# Patient Record
Sex: Female | Born: 1995 | Race: Black or African American | Hispanic: No | Marital: Single
Health system: Midwestern US, Community
[De-identification: ages and names within clinical notes are randomized; demographics above are authoritative.]

## PROBLEM LIST (undated history)

## (undated) DIAGNOSIS — R1900 Intra-abdominal and pelvic swelling, mass and lump, unspecified site: Secondary | ICD-10-CM

---
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
## 2013-07-09 ENCOUNTER — Encounter (HOSPITAL_COMMUNITY): Payer: Self-pay | Admitting: Nurse Practitioner

## 2013-07-09 ENCOUNTER — Emergency Department (HOSPITAL_COMMUNITY)
Admission: EM | Admit: 2013-07-09 | Discharge: 2013-07-09 | Disposition: A | Discharge status: Home / Self Care | Payer: Medicaid Other | Attending: Emergency Medicine | Admitting: Emergency Medicine

## 2013-07-09 ENCOUNTER — Emergency Department (HOSPITAL_COMMUNITY): Payer: Medicaid Other

## 2013-07-09 DIAGNOSIS — R112 Nausea with vomiting, unspecified: Secondary | ICD-10-CM | POA: Insufficient documentation

## 2013-07-09 DIAGNOSIS — R51 Headache: Secondary | ICD-10-CM | POA: Insufficient documentation

## 2013-07-09 MED ORDER — ONDANSETRON HCL 4 MG/2ML IJ SOLN
4.0000 mg | Freq: Once | INTRAMUSCULAR | Status: AC
  Administered 2013-07-09: 4 mg via INTRAVENOUS
  Filled 2013-07-09: qty 2

## 2013-07-09 MED ORDER — DIPHENHYDRAMINE HCL 50 MG/ML IJ SOLN
25.0000 mg | Freq: Once | INTRAMUSCULAR | Status: AC
  Administered 2013-07-09: 25 mg via INTRAVENOUS
  Filled 2013-07-09: qty 1

## 2013-07-09 MED ORDER — SODIUM CHLORIDE 0.9 % IV BOLUS (SEPSIS)
1000.0000 mL | Freq: Once | INTRAVENOUS | Status: AC
  Administered 2013-07-09: 1000 mL via INTRAVENOUS

## 2013-07-09 MED ORDER — METOCLOPRAMIDE HCL 5 MG/ML IJ SOLN
10.0000 mg | Freq: Once | INTRAMUSCULAR | Status: AC
  Administered 2013-07-09: 10 mg via INTRAVENOUS
  Filled 2013-07-09: qty 2

## 2013-07-09 MED ORDER — KETOROLAC TROMETHAMINE 30 MG/ML IJ SOLN
30.0000 mg | Freq: Once | INTRAMUSCULAR | Status: AC
  Administered 2013-07-09: 30 mg via INTRAVENOUS
  Filled 2013-07-09: qty 1

## 2013-07-09 NOTE — ED Notes (Signed)
C/o frontal headache x 4 days with vomiting, unrelieved with OTC caffeine pills. Has been unable to tolerate oral intake. Denies fevers

## 2013-07-09 NOTE — ED Provider Notes (Signed)
CSN: [id number]     Arrival date & time 07/09/13  1058 History   First MD Initiated Contact with Patient 07/09/13 1200     Chief Complaint  Patient presents with  . Headache   (Consider location/radiation/quality/duration/timing/severity/associated sxs/prior Treatment) HPI Comments: Patient complains of a four-day history of frontal headache associated with nausea and vomiting. This is gradually gotten worse. She woke up with it 4 days ago and slowly progress. Denies thunderclap onset. Denies any focal weakness, numbness or tingling. No history of migraine or headaches otherwise. Denies any fevers. Denies any abdominal pain, chest pain, cough or congestion. She's taking caffeine pills without relief.  The history is provided by the patient.    History reviewed. No pertinent past medical history. History reviewed. No pertinent past surgical history. History reviewed. No pertinent family history. History  Substance Use Topics  . Smoking status: Never Smoker   . Smokeless tobacco: Not on file  . Alcohol Use: No   OB History   Grav Para Term Preterm Abortions TAB SAB Ect Mult Living                 Review of Systems  Constitutional: Negative for fever, activity change and appetite change.  HENT: Negative for congestion and rhinorrhea.   Eyes: Positive for photophobia. Negative for visual disturbance.  Respiratory: Negative for cough, chest tightness and shortness of breath.   Cardiovascular: Negative for chest pain.  Gastrointestinal: Positive for nausea and vomiting. Negative for abdominal pain.  Genitourinary: Negative for dysuria, hematuria, vaginal bleeding and vaginal discharge.  Musculoskeletal: Negative for back pain.  Skin: Negative for rash.  Neurological: Positive for headaches. Negative for dizziness and weakness.  A complete 10 system review of systems was obtained and all systems are negative except as noted in the HPI and PMH.    Allergies  Review of patient's  allergies indicates no known allergies.  Home Medications  No current outpatient prescriptions on file. BP 102/65  Pulse 79  Temp(Src) 99.1 F (37.3 C) (Oral)  Resp 16  SpO2 100%  LMP 07/03/2013 Physical Exam  Constitutional: She is oriented to person, place, and time. She appears well-developed and well-nourished. No distress.  HENT:  Head: Normocephalic and atraumatic.  Mouth/Throat: Oropharynx is clear and moist. No oropharyngeal exudate.  Eyes: Conjunctivae and EOM are normal. Pupils are equal, round, and reactive to light.  No appreciable papilledema  Neck: Normal range of motion. Neck supple.  No meningismus  Cardiovascular: Normal rate, regular rhythm and normal heart sounds.   No murmur heard. Pulmonary/Chest: Effort normal and breath sounds normal. No respiratory distress.  Abdominal: Soft. There is no tenderness. There is no rebound and no guarding.  Musculoskeletal: Normal range of motion. She exhibits no edema and no tenderness.  Neurological: She is alert and oriented to person, place, and time. No cranial nerve deficit. She exhibits normal muscle tone. Coordination normal.  CN 2-12 intact, no ataxia on finger to nose, no nystagmus, 5/5 strength throughout, no pronator drift, Romberg negative, normal gait.   Skin: Skin is warm.    ED Course  Procedures (including critical care time) Labs Review Labs Reviewed - No data to display Imaging Review Ct Head Wo Contrast  07/09/2013   *RADIOLOGY REPORT*  Clinical Data: Frontal headache for 4 days with vomiting,  CT HEAD WITHOUT CONTRAST  Technique:  Contiguous axial images were obtained from the base of the skull through the vertex without contrast.  Comparison: None.  Findings: Calvarium intact.  No  hemorrhage, infarct, mass, or hydrocephalus.  No extra-axial fluid.  IMPRESSION: Normal study   Original Report Authenticated By: Esperanza Heir, M.D.    MDM   1. Headache    Gradual onset headache with nausea, vomiting,  photophobia with 4 days duration. No focal weakness, numbness or tingling.  Neurological exam intact. CT head is negative.  Patient's headache sounds migrainous or possibly pseudotumor cerebra related given her body habitus. No visual changes.   Nevertheless her headache is improved with headache cocktail. she feels ready to go home. She is able to tolerate by mouth. She will be given neurology and PCP followup return precautions discussed.  Glynn Octave, MD 07/09/13 [phone]

## 2013-07-09 NOTE — ED Notes (Signed)
Patient transported to CT 

## 2014-08-31 IMAGING — CT CT HEAD W/O CM
2 series · 16 of 30 positions shown, 20 images · non-contrast
Comparison: None.

CLINICAL DATA: Frontal headache for 4 days with vomiting,

CT HEAD WITHOUT CONTRAST
TECHNIQUE: Contiguous axial images were obtained from the base of
the skull through the vertex without contrast.

[Series 2: head w/o · axial · non-contrast · 0.49mm/px · z∈[+118,+243]mm · 13 of 31 slices shown, 17 images]
[im 3/31  brain]
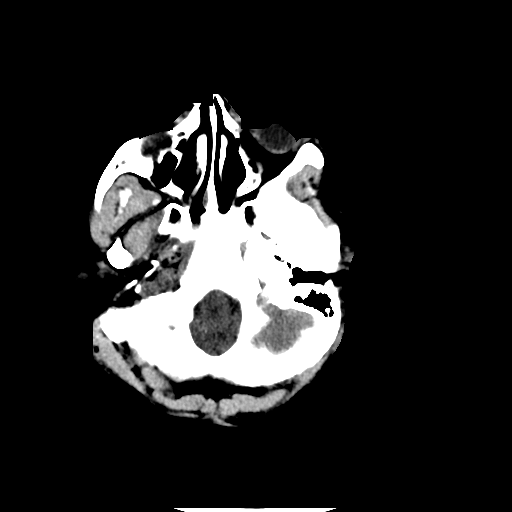
[im 3/31  bone]
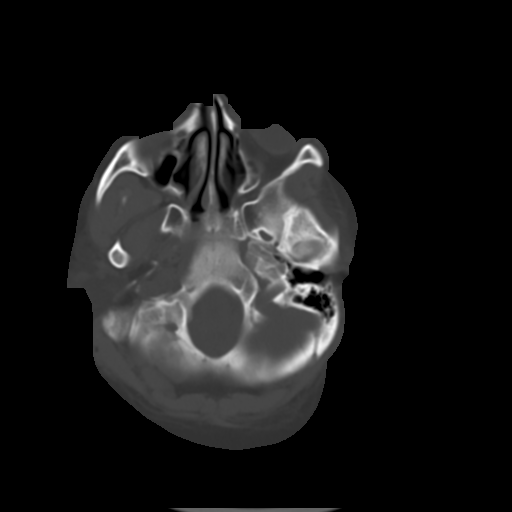
[im 5/31  brain]
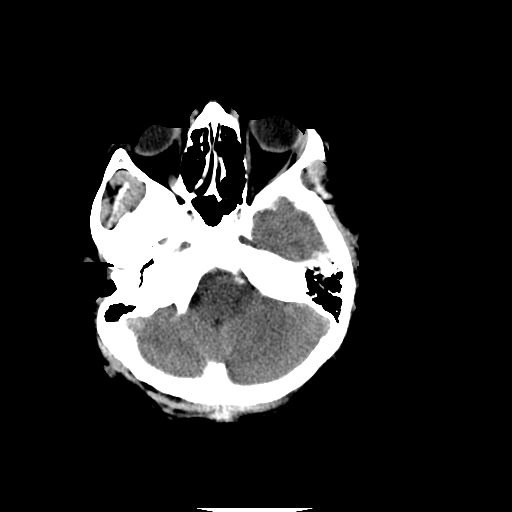
[im 7/31  brain]
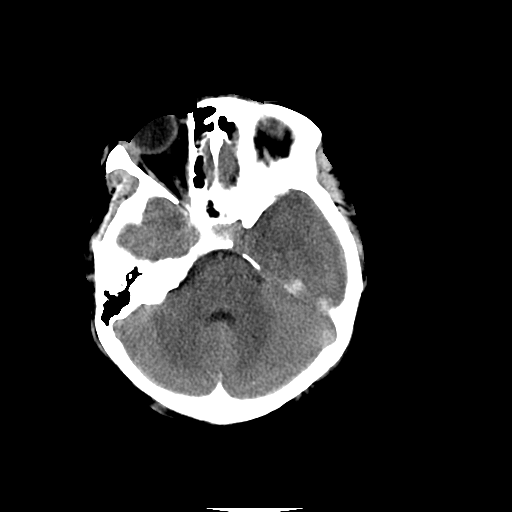
[im 9/31  brain]
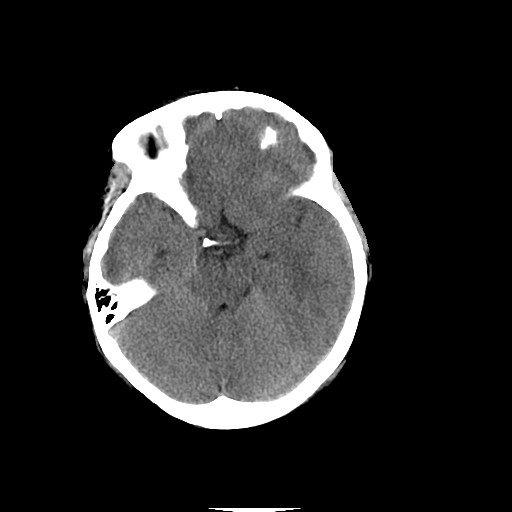
[im 11/31  brain]
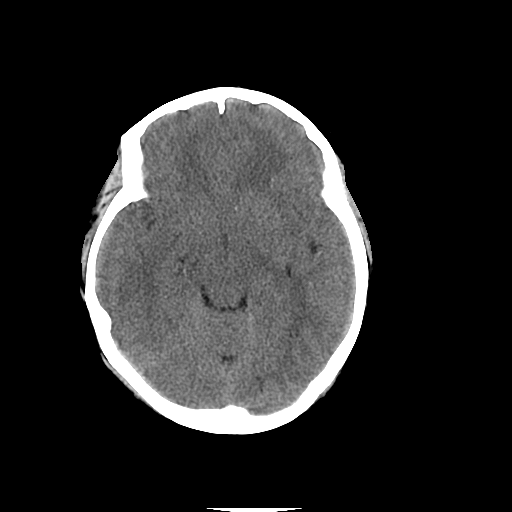
[im 11/31  bone]
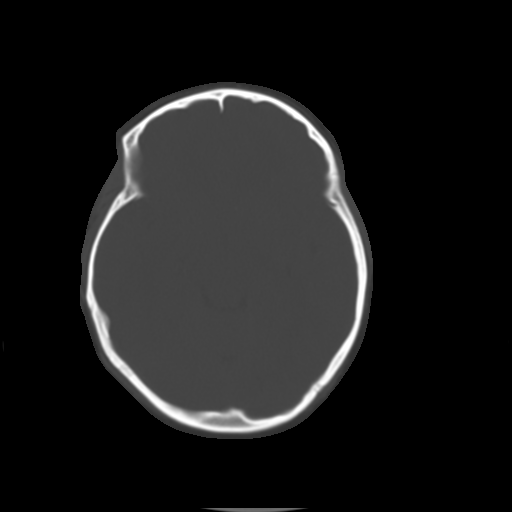
[im 13/31  brain]
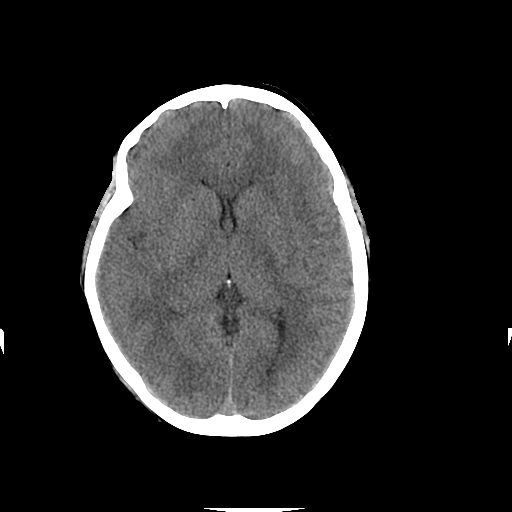
[im 16/31  brain]
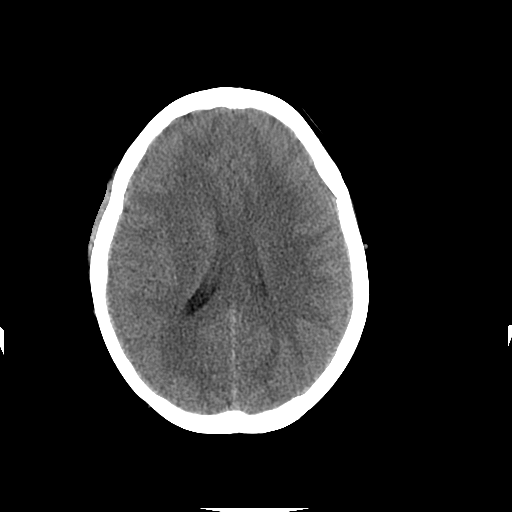
[im 18/31  brain]
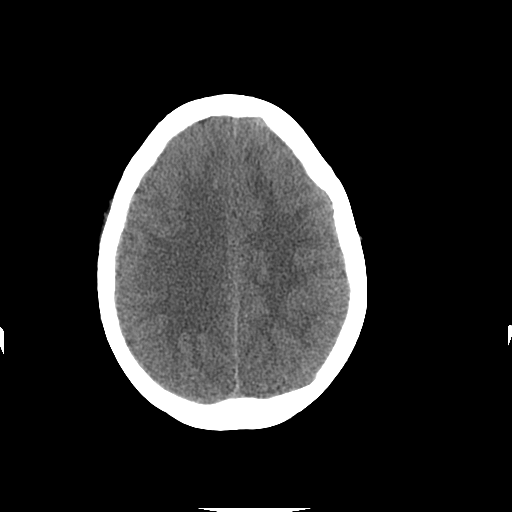
[im 20/31  brain]
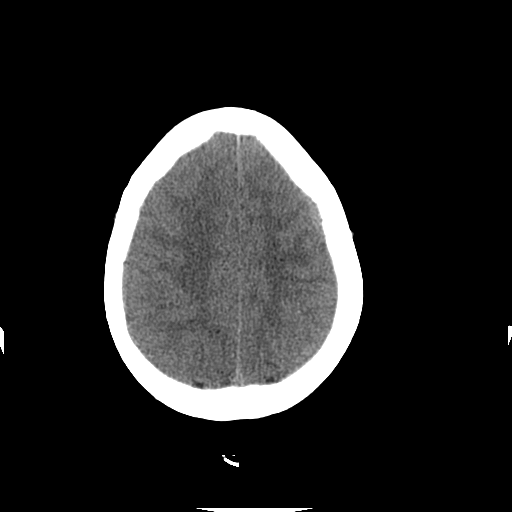
[im 20/31  bone]
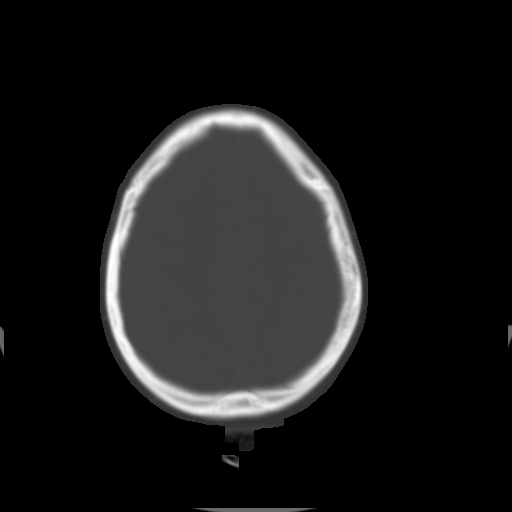
[im 22/31  brain]
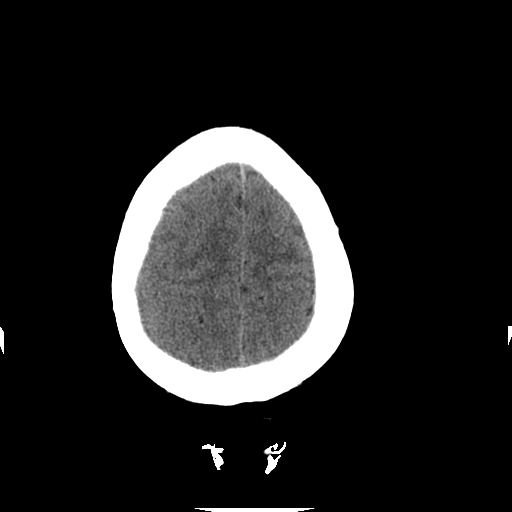
[im 24/31  brain]
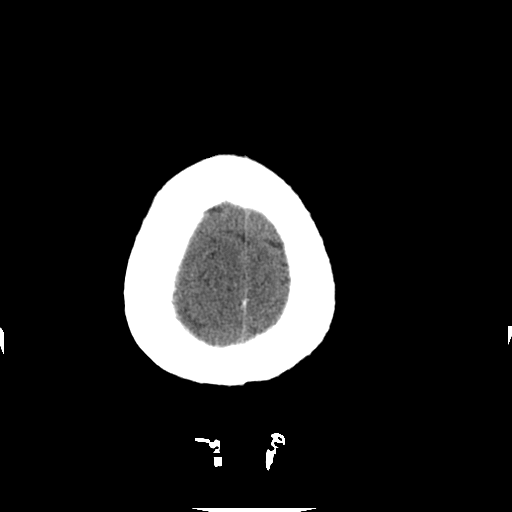
[im 26/31  brain]
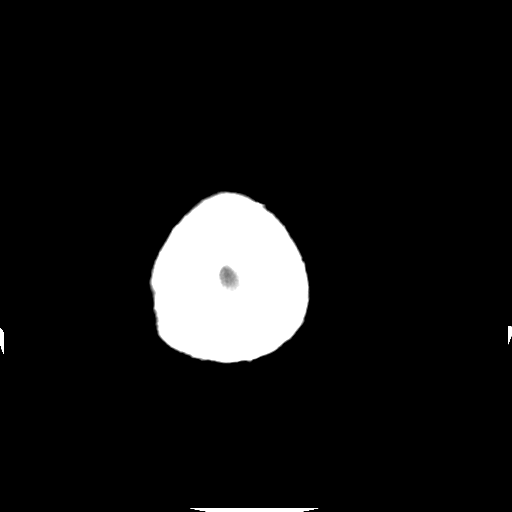
[im 28/31  brain]
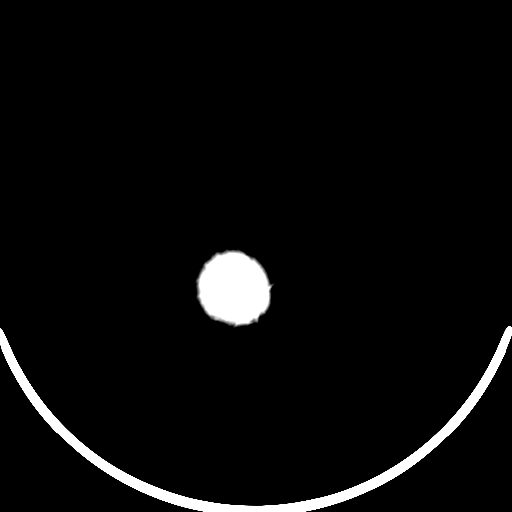
[im 28/31  bone]
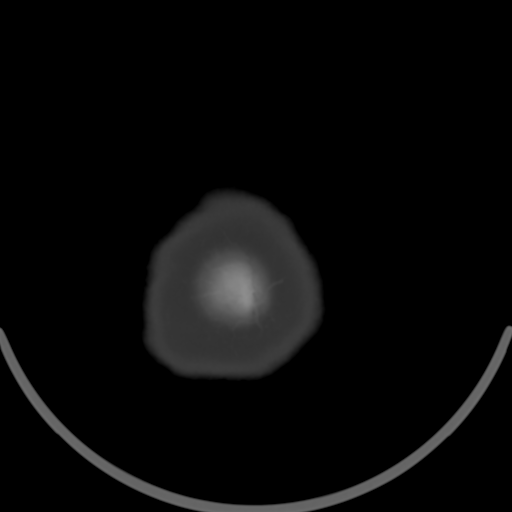

[Series 3: head w/o bone · axial · non-contrast · 0.49mm/px · z∈[+118,+158]mm · 3 of 31 slices shown]
[im 3/31  bone]
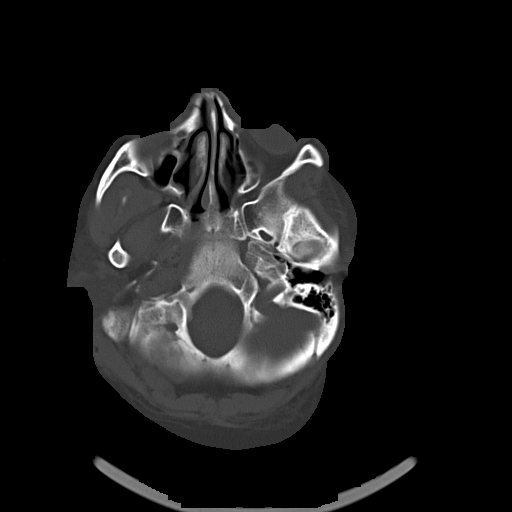
[im 7/31  bone]
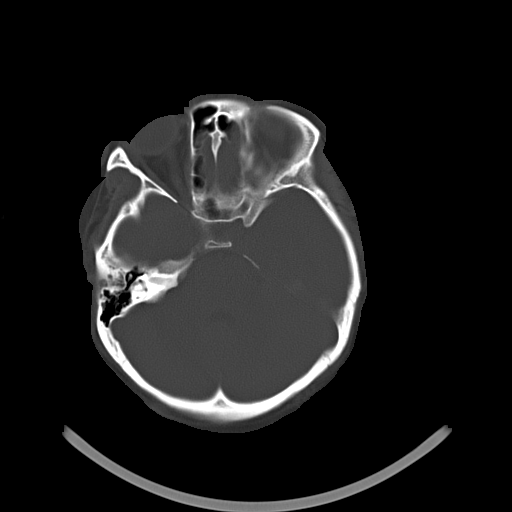
[im 11/31  bone]
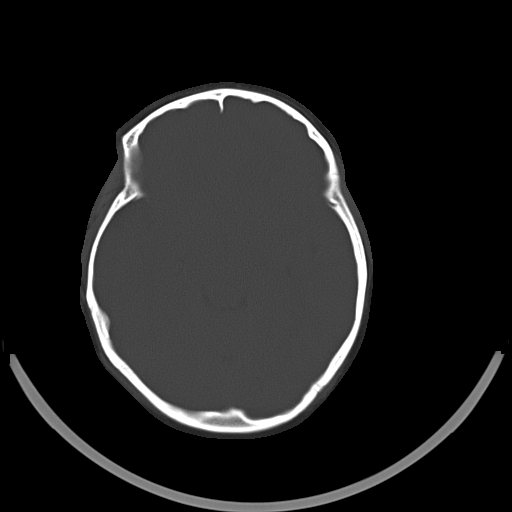

[16 of 30 positions shown; findings below may reference images not displayed]

FINDINGS: Calvarium intact.  No hemorrhage, infarct, mass, or
hydrocephalus.  No extra-axial fluid.
IMPRESSION: Normal study

## 2014-11-28 ENCOUNTER — Encounter (HOSPITAL_COMMUNITY): Payer: Self-pay | Admitting: Physical Medicine and Rehabilitation

## 2014-11-28 ENCOUNTER — Emergency Department (HOSPITAL_COMMUNITY)
Admission: EM | Admit: 2014-11-28 | Discharge: 2014-11-28 | Disposition: A | Discharge status: Home / Self Care | Payer: Medicaid Other | Attending: Emergency Medicine | Admitting: Emergency Medicine

## 2014-11-28 DIAGNOSIS — Z3202 Encounter for pregnancy test, result negative: Secondary | ICD-10-CM | POA: Diagnosis not present

## 2014-11-28 DIAGNOSIS — R102 Pelvic and perineal pain: Secondary | ICD-10-CM | POA: Diagnosis present

## 2014-11-28 DIAGNOSIS — N898 Other specified noninflammatory disorders of vagina: Secondary | ICD-10-CM | POA: Diagnosis not present

## 2014-11-28 LAB — URINE MICROSCOPIC-ADD ON

## 2014-11-28 LAB — URINALYSIS, ROUTINE W REFLEX MICROSCOPIC
BILIRUBIN URINE: NEGATIVE
Glucose, UA: NEGATIVE mg/dL
Hgb urine dipstick: NEGATIVE
KETONES UR: NEGATIVE mg/dL
Nitrite: NEGATIVE
PH: 6 (ref 5.0–8.0)
PROTEIN: NEGATIVE mg/dL
Specific Gravity, Urine: 1.029 (ref 1.005–1.030)
Urobilinogen, UA: 0.2 mg/dL (ref 0.0–1.0)

## 2014-11-28 LAB — WET PREP, GENITAL
Clue Cells Wet Prep HPF POC: NONE SEEN
Trich, Wet Prep: NONE SEEN
Yeast Wet Prep HPF POC: NONE SEEN

## 2014-11-28 LAB — PREGNANCY, URINE: Preg Test, Ur: NEGATIVE

## 2014-11-28 MED ORDER — VALACYCLOVIR HCL 1 G PO TABS: 1000.0000 mg | ORAL_TABLET | Freq: Two times a day (BID) | ORAL | Status: AC

## 2014-11-28 NOTE — Discharge Instructions (Signed)
Genital Herpes As we discussed, this may be genital herpes. Follow-up with your doctor. This may also be related to recent soap use. Return to the ED if you develop new  Or worsening symptoms. Genital herpes is a sexually transmitted disease. This means that it is a disease passed by having sex with an infected person. There is no cure for genital herpes. The time between attacks can be months to years. The virus may live in a person but produce no problems (symptoms). This infection can be passed to a baby as it travels down the birth canal (vagina). In a newborn, this can cause central nervous system damage, eye damage, or even death. The virus that causes genital herpes is usually HSV-2 virus. The virus that causes oral herpes is usually HSV-1. The diagnosis (learning what is wrong) is made through culture results. SYMPTOMS  Usually symptoms of pain and itching begin a few days to a week after contact. It first appears as small blisters that progress to small painful ulcers which then scab over and heal after several days. It affects the outer genitalia, birth canal, cervix, penis, anal area, buttocks, and thighs. HOME CARE INSTRUCTIONS   Keep ulcerated areas dry and clean.  Take medications as directed. Antiviral medications can speed up healing. They will not prevent recurrences or cure this infection. These medications can also be taken for suppression if there are frequent recurrences.  While the infection is active, it is contagious. Avoid all sexual contact during active infections.  Condoms may help prevent spread of the herpes virus.  Practice safe sex.  Wash your hands thoroughly after touching the genital area.  Avoid touching your eyes after touching your genital area.  Inform your caregiver if you have had genital herpes and become pregnant. It is your responsibility to insure a safe outcome for your baby in this pregnancy.  Only take over-the-counter or prescription medicines  for pain, discomfort, or fever as directed by your caregiver. SEEK MEDICAL CARE IF:   You have a recurrence of this infection.  You do not respond to medications and are not improving.  You have new sources of pain or discharge which have changed from the original infection.  You have an oral temperature above 102 F (38.9 C).  You develop abdominal pain.  You develop eye pain or signs of eye infection. Document Released: 10/09/2000 Document Revised: 01/04/2012 Document Reviewed: 10/30/2009 [facility] HospitalExitCare Patient Information 2015 [employer], [place]. This information is not intended to replace advice given to you by your health care provider. Make sure you discuss any questions you have with your health care provider.

## 2014-11-28 NOTE — ED Notes (Signed)
Pt c/o skin irritation around outer lips of vagina. Area not reddened or inflamed.

## 2014-11-28 NOTE — ED Provider Notes (Signed)
CSN: [id number]     Arrival date & time 11/28/14  1003 History   First MD Initiated Contact with Patient 11/28/14 1113     Chief Complaint  Patient presents with  . Vaginal Pain     (Consider location/radiation/quality/duration/timing/severity/associated sxs/prior Treatment) HPI Comments: Patient reports vaginal irritation for the past 2 days. She reports "sores" in her vaginal area that she noticed 2 days ago. Denies any recent unprotected sex. Denies any chest pain, shortness of breath, abdominal pain, nausea vomiting. No fever. No urinary symptoms no hematuria. States she's not had sex in 18 months. Last menstrual cycle last month. She does endorse changing soaps recently.  The history is provided by the patient.    History reviewed. No pertinent past medical history. History reviewed. No pertinent past surgical history. No family history on file. History  Substance Use Topics  . Smoking status: Never Smoker   . Smokeless tobacco: Not on file  . Alcohol Use: No   OB History    No data available     Review of Systems  Constitutional: Negative for fever, activity change and appetite change.  HENT: Negative for congestion and rhinorrhea.   Eyes: Negative for visual disturbance.  Respiratory: Negative for cough and chest tightness.   Cardiovascular: Negative for chest pain.  Gastrointestinal: Negative for nausea, vomiting and abdominal pain.  Genitourinary: Positive for vaginal discharge and vaginal pain.  Musculoskeletal: Negative for myalgias, back pain and arthralgias.  Skin: Negative for rash.  Neurological: Negative for dizziness, weakness and headaches.  A complete 10 system review of systems was obtained and all systems are negative except as noted in the HPI and PMH.      Allergies  Review of patient's allergies indicates no known allergies.  Home Medications   Prior to Admission medications   Medication Sig Start Date End Date Taking? Authorizing Provider   valACYclovir (VALTREX) 1000 MG tablet Take 1 tablet (1,000 mg total) by mouth 2 (two) times daily. 11/28/14 12/09/14  Glynn OctaveStephen Dequavius Kuhner, MD   BP 122/99 mmHg  Pulse 94  Temp(Src) 98.7 F (37.1 C) (Oral)  Resp 18  SpO2 100% Physical Exam  Constitutional: She is oriented to person, place, and time. She appears well-developed and well-nourished. No distress.  HENT:  Head: Normocephalic and atraumatic.  Mouth/Throat: Oropharynx is clear and moist. No oropharyngeal exudate.  Eyes: Conjunctivae and EOM are normal. Pupils are equal, round, and reactive to light.  Neck: Normal range of motion. Neck supple.  No meningismus.  Cardiovascular: Normal rate, regular rhythm, normal heart sounds and intact distal pulses.   No murmur heard. Pulmonary/Chest: Effort normal and breath sounds normal. No respiratory distress.  Abdominal: Soft. There is no tenderness. There is no rebound and no guarding.  Genitourinary:  Chaperone present.  Small ulceration to L labia minora. No vesicular lesions. No CMT. No adnexal tenderness.  Musculoskeletal: Normal range of motion. She exhibits no edema or tenderness.  Neurological: She is alert and oriented to person, place, and time. No cranial nerve deficit. She exhibits normal muscle tone. Coordination normal.  No ataxia on finger to nose bilaterally. No pronator drift. 5/5 strength throughout. CN 2-12 intact. Negative Romberg. Equal grip strength. Sensation intact. Gait is normal.   Skin: Skin is warm.  Psychiatric: She has a normal mood and affect. Her behavior is normal.  Nursing note and vitals reviewed.   ED Course  Procedures (including critical care time) Labs Review Labs Reviewed  WET PREP, GENITAL - Abnormal; Notable for the  following:    WBC, Wet Prep HPF POC MODERATE (*)    All other components within normal limits  URINALYSIS, ROUTINE W REFLEX MICROSCOPIC - Abnormal; Notable for the following:    Leukocytes, UA SMALL (*)    All other components within  normal limits  URINE MICROSCOPIC-ADD ON - Abnormal; Notable for the following:    Squamous Epithelial / LPF FEW (*)    Bacteria, UA FEW (*)    All other components within normal limits  HERPES SIMPLEX VIRUS CULTURE  PREGNANCY, URINE  GC/CHLAMYDIA PROBE AMP ([place])    Imaging Review No results found.   EKG Interpretation None      MDM   Final diagnoses:  Vaginal lesion   2 day history of vaginal pain and "sores". Denies sexual activity in past 18 months. No discharge. No hematuria or dysuria. No abdominal pain.  Exam shows small ulceration to left labia. No vesicular lesions. Pelvic exam otherwise benign. UA negative. HCG negative  Treat empirically for possible HSV. Culture sent. Discussed the patient. She denies recent sexual contact. Follow-up with gynecology and PCP.    Glynn Octave, MD 11/28/14 [phone]

## 2014-11-28 NOTE — ED Notes (Signed)
EDP at bedside  

## 2014-11-28 NOTE — ED Notes (Signed)
Pt reports vaginal pain and "sores" to vaginal area. Noticed this Tuesday morning. Denies recent unprotected sex. No signs of acute distress noted.

## 2014-11-29 LAB — GC/CHLAMYDIA PROBE AMP (~~LOC~~) NOT AT ARMC
Chlamydia: NEGATIVE
NEISSERIA GONORRHEA: NEGATIVE

## 2014-11-30 LAB — HERPES SIMPLEX VIRUS CULTURE: Culture: NOT DETECTED

## 2015-10-03 ENCOUNTER — Emergency Department (HOSPITAL_COMMUNITY)
Admission: EM | Admit: 2015-10-03 | Discharge: 2015-10-03 | Disposition: A | Discharge status: Home / Self Care | Payer: Medicaid Other | Attending: Emergency Medicine | Admitting: Emergency Medicine

## 2015-10-03 ENCOUNTER — Encounter (HOSPITAL_COMMUNITY): Payer: Self-pay | Admitting: Emergency Medicine

## 2015-10-03 DIAGNOSIS — L02219 Cutaneous abscess of trunk, unspecified: Secondary | ICD-10-CM

## 2015-10-03 DIAGNOSIS — L02211 Cutaneous abscess of abdominal wall: Secondary | ICD-10-CM | POA: Diagnosis present

## 2015-10-03 MED ORDER — ACETAMINOPHEN 325 MG PO TABS
650.0000 mg | ORAL_TABLET | Freq: Once | ORAL | Status: AC
  Administered 2015-10-03: 650 mg via ORAL
  Filled 2015-10-03: qty 2

## 2015-10-03 MED ORDER — CEPHALEXIN 500 MG PO CAPS: 500.0000 mg | ORAL_CAPSULE | Freq: Four times a day (QID) | ORAL | Status: AC

## 2015-10-03 MED ORDER — SULFAMETHOXAZOLE-TRIMETHOPRIM 800-160 MG PO TABS: 1.0000 | ORAL_TABLET | Freq: Two times a day (BID) | ORAL | Status: AC

## 2015-10-03 MED ORDER — TRAMADOL HCL 50 MG PO TABS: 50.0000 mg | ORAL_TABLET | Freq: Four times a day (QID) | ORAL | Status: AC | PRN

## 2015-10-03 MED ORDER — LIDOCAINE HCL (PF) 1 % IJ SOLN
5.0000 mL | Freq: Once | INTRAMUSCULAR | Status: AC
  Administered 2015-10-03: 5 mL via INTRADERMAL
  Filled 2015-10-03: qty 5

## 2015-10-03 NOTE — ED Notes (Signed)
Pt from home with c/o genital abscess starting this past Tues.  Pt denies hx of the same, denies shaving.  Pt in NAD, A&O, ambulatory.

## 2015-10-03 NOTE — Discharge Instructions (Signed)
Take the prescribed medication as directed. Recommend warm compresses at home or warm soaks to help aid drainage. Return to the ED for new or worsening symptoms.  Abscess An abscess is an infected area that contains a collection of pus and debris.It can occur in almost any part of the body. An abscess is also known as a furuncle or boil. CAUSES  An abscess occurs when tissue gets infected. This can occur from blockage of oil or sweat glands, infection of hair follicles, or a minor injury to the skin. As the body tries to fight the infection, pus collects in the area and creates pressure under the skin. This pressure causes pain. People with weakened immune systems have difficulty fighting infections and get certain abscesses more often.  SYMPTOMS Usually an abscess develops on the skin and becomes a painful mass that is red, warm, and tender. If the abscess forms under the skin, you may feel a moveable soft area under the skin. Some abscesses break open (rupture) on their own, but most will continue to get worse without care. The infection can spread deeper into the body and eventually into the bloodstream, causing you to feel ill.  DIAGNOSIS  Your caregiver will take your medical history and perform a physical exam. A sample of fluid may also be taken from the abscess to determine what is causing your infection. TREATMENT  Your caregiver may prescribe antibiotic medicines to fight the infection. However, taking antibiotics alone usually does not cure an abscess. Your caregiver may need to make a small cut (incision) in the abscess to drain the pus. In some cases, gauze is packed into the abscess to reduce pain and to continue draining the area. HOME CARE INSTRUCTIONS   Only take over-the-counter or prescription medicines for pain, discomfort, or fever as directed by your caregiver.  If you were prescribed antibiotics, take them as directed. Finish them even if you start to feel better.  If  gauze is used, follow your caregiver's directions for changing the gauze.  To avoid spreading the infection:  Keep your draining abscess covered with a bandage.  Wash your hands well.  Do not share personal care items, towels, or whirlpools with others.  Avoid skin contact with others.  Keep your skin and clothes clean around the abscess.  Keep all follow-up appointments as directed by your caregiver. SEEK MEDICAL CARE IF:   You have increased pain, swelling, redness, fluid drainage, or bleeding.  You have muscle aches, chills, or a general ill feeling.  You have a fever. MAKE SURE YOU:   Understand these instructions.  Will watch your condition.  Will get help right away if you are not doing well or get worse.   This information is not intended to replace advice given to you by your health care provider. Make sure you discuss any questions you have with your health care provider.   Document Released: 07/22/2005 Document Revised: 04/12/2012 Document Reviewed: 12/25/2011 [employer] Interactive Patient Education [employer]

## 2015-10-03 NOTE — ED Provider Notes (Signed)
CSN: [id number]     Arrival date & time 10/03/15  1039 History   First MD Initiated Contact with Patient 10/03/15 1238     Chief Complaint  Patient presents with  . Abscess     (Consider location/radiation/quality/duration/timing/severity/associated sxs/prior Treatment) Patient is a 19 y.o. female presenting with abscess. The history is provided by the patient and medical records.  Abscess    19 year old female with no significant past medical history presenting to the ED for an abscess right genital region. She states this began on Tuesday and has been becoming progressively larger and more painful. Patient denies any history of abscesses in the past. States she has not shaved recently. She denies any urinary symptoms or vaginal discharge. She reports subjective fever last night, no chills or sweats. No abdominal pain, nausea, vomiting, or diarrhea. Patient has a history of diabetes, HIV, or MRSA.  History reviewed. No pertinent past medical history. History reviewed. No pertinent past surgical history. History reviewed. No pertinent family history. Social History  Substance Use Topics  . Smoking status: Never Smoker   . Smokeless tobacco: Never Used  . Alcohol Use: No   OB History    No data available     Review of Systems  Skin:       Abscess  All other systems reviewed and are negative.     Allergies  Review of patient's allergies indicates no known allergies.  Home Medications   Prior to Admission medications   Not on File   BP 126/73 mmHg  Pulse 110  Temp(Src) 100.7 F (38.2 C) (Oral)  Resp 18  SpO2 99%  LMP 10/01/2015 (Exact Date)   Physical Exam  Constitutional: She is oriented to person, place, and time. She appears well-developed and well-nourished. No distress.  HENT:  Head: Normocephalic and atraumatic.  Mouth/Throat: Oropharynx is clear and moist.  Eyes: Conjunctivae and EOM are normal. Pupils are equal, round, and reactive to light.  Neck:  Normal range of motion. Neck supple.  Cardiovascular: Normal rate, regular rhythm and normal heart sounds.   Pulmonary/Chest: Effort normal and breath sounds normal. No respiratory distress. She has no wheezes.  Abdominal: Soft. Bowel sounds are normal.  Genitourinary:  As is noted of right suprapubic region with some extension into right labia; there is warmth to touch and some induration noted; central fluctuance without active drainage; left labia is not involved  Musculoskeletal: Normal range of motion.  Neurological: She is alert and oriented to person, place, and time.  Skin: Skin is warm and dry. She is not diaphoretic.  Psychiatric: She has a normal mood and affect.  Nursing note and vitals reviewed.   ED Course  Procedures (including critical care time)  INCISION AND DRAINAGE Performed by: Garlon HatchetSANDERS, LISA M Consent: Verbal consent obtained. Risks and benefits: risks, benefits and alternatives were discussed Type: abscess  Body area: right suprapubic region  Anesthesia: local infiltration  Incision was made with a scalpel.  Local anesthetic: lidocaine 1% without epinephrine  Anesthetic total: 5 ml  Complexity: complex Blunt dissection to break up loculations  Drainage: purulent  Drainage amount: large  Packing material: none  Patient tolerance: Patient tolerated the procedure well with no immediate complications.   Labs Review Labs Reviewed - No data to display  Imaging Review No results found. I have personally reviewed and evaluated these images and lab results as part of my medical decision-making.   EKG Interpretation None      MDM   Final diagnoses:  Suprapubic abscess   19 year old female here with abscess of right suprapubic region extending into right labia. Patient with low-grade fever and mild tachycardia which I suspect are related to her abscess.  She has no other clinical signs or symptoms concerning for gross bodily infection.  I&D was  performed as above, patient tolerated very well. Patient we discharged home antibiotics. Encouraged warm compresses or soaks at home to help aid drainage and promote healing. Patient will follow-up with PCP.  Discussed plan with patient, he/she acknowledged understanding and agreed with plan of care.  Return precautions given for new or worsening symptoms.  Garlon Hatchet, PA-C 10/03/15 1351  Alvira Monday, MD 10/04/15 [phone]

## 2015-10-03 NOTE — ED Notes (Signed)
Hard, red area on right suprapubic area to right labia. Pt has had a temperature at home-- per mom.

## 2015-11-14 ENCOUNTER — Encounter (HOSPITAL_COMMUNITY): Payer: Self-pay | Admitting: Family Medicine

## 2015-11-14 ENCOUNTER — Emergency Department (HOSPITAL_COMMUNITY)
Admission: EM | Admit: 2015-11-14 | Discharge: 2015-11-14 | Disposition: A | Discharge status: Home / Self Care | Payer: Medicaid Other | Attending: Emergency Medicine | Admitting: Emergency Medicine

## 2015-11-14 DIAGNOSIS — Z792 Long term (current) use of antibiotics: Secondary | ICD-10-CM | POA: Insufficient documentation

## 2015-11-14 DIAGNOSIS — N764 Abscess of vulva: Secondary | ICD-10-CM | POA: Insufficient documentation

## 2015-11-14 LAB — CBG MONITORING, ED: Glucose-Capillary: 89 mg/dL (ref 65–99)

## 2015-11-14 NOTE — ED Notes (Signed)
CBG 89 mg/dL reported to Adele Barthel, RN

## 2015-11-14 NOTE — Discharge Instructions (Signed)
Incision and Drainage Incision and drainage is a procedure in which a sac-like structure (cystic structure) is opened and drained. The area to be drained usually contains material such as pus, fluid, or blood.  LET YOUR CAREGIVER KNOW ABOUT:   Allergies to medicine.  Medicines taken, including vitamins, herbs, eyedrops, over-the-counter medicines, and creams.  Use of steroids (by mouth or creams).  Previous problems with anesthetics or numbing medicines.  History of bleeding problems or blood clots.  Previous surgery.  Other health problems, including diabetes and kidney problems.  Possibility of pregnancy, if this applies. RISKS AND COMPLICATIONS  Pain.  Bleeding.  Scarring.  Infection. BEFORE THE PROCEDURE  You may need to have an ultrasound or other imaging tests to see how large or deep your cystic structure is. Blood tests may also be used to determine if you have an infection or how severe the infection is. You may need to have a tetanus shot. PROCEDURE  The affected area is cleaned with a cleaning fluid. The cyst area will then be numbed with a medicine (local anesthetic). A small incision will be made in the cystic structure. A syringe or catheter may be used to drain the contents of the cystic structure, or the contents may be squeezed out. The area will then be flushed with a cleansing solution. After cleansing the area, it is often gently packed with a gauze or another wound dressing. Once it is packed, it will be covered with gauze and tape or some other type of wound dressing. AFTER THE PROCEDURE   Often, you will be allowed to go home right after the procedure.  You may be given antibiotic medicine to prevent or heal an infection.  If the area was packed with gauze or some other wound dressing, you will likely need to come back in 1 to 2 days to get it removed.  The area should heal in about 14 days.   This information is not intended to replace advice given  to you by your health care provider. Make sure you discuss any questions you have with your health care provider.   Document Released: 04/07/2001 Document Revised: 04/12/2012 Document Reviewed: 12/07/2011 Elsevier Interactive Patient Education 2016 [employer]  

## 2015-11-14 NOTE — ED Provider Notes (Signed)
CSN: [id number]     Arrival date & time 11/14/15  1253 History   By signing my name below, I, Essence Howell, attest that this documentation has been prepared under the direction and in the presence of Arthor Captain, PA-C Electronically Signed: Charline Bills, ED Scribe 11/16/2015 at 2:33 PM.   Chief Complaint  Patient presents with  . Abscess   The history is provided by the patient. No language interpreter was used.   HPI Comments: Pam Hartman is a 20 y.o. female who presents to the Emergency Department complaining of a gradually worsening area of swelling to the labia for 3 days. Pt reports constant pain to the area that is exacerbated with palpation. She reports h/o previous abscess to the area 1 month ago that required I&D. Pt has tried warm compresses without significant relief. She denies recent shaving, fever, nausea, vomiting. No h/o DM.   History reviewed. No pertinent past medical history. History reviewed. No pertinent past surgical history. No family history on file. Social History  Substance Use Topics  . Smoking status: Never Smoker   . Smokeless tobacco: Never Used  . Alcohol Use: No   OB History    No data available     Review of Systems  Constitutional: Negative for fever.  Gastrointestinal: Negative for nausea and vomiting.  Genitourinary:       + Abscess to labia   Allergies  Review of patient's allergies indicates no known allergies.  Home Medications   Prior to Admission medications   Medication Sig Start Date End Date Taking? Authorizing Provider  cephALEXin (KEFLEX) 500 MG capsule Take 1 capsule (500 mg total) by mouth 4 (four) times daily. 10/03/15   Garlon Hatchet, PA-C  traMADol (ULTRAM) 50 MG tablet Take 1 tablet (50 mg total) by mouth every 6 (six) hours as needed. 10/03/15   Garlon Hatchet, PA-C   BP 101/47 mmHg  Pulse 100  Temp(Src) 98.2 F (36.8 C) (Oral)  Resp 18  Ht  (1.6 m)  Wt 220 lb (99.791 kg)  BMI 38.98 kg/m2  SpO2 98%  LMP  10/27/2015  Physical Exam  Constitutional: She is oriented to person, place, and time. She appears well-developed and well-nourished. No distress.  HENT:  Head: Normocephalic and atraumatic.  Eyes: Conjunctivae and EOM are normal.  Neck: Neck supple. No tracheal deviation present.  Cardiovascular: Normal rate.   Pulmonary/Chest: Effort normal. No respiratory distress.  Genitourinary:   3 cm fluctuant abscess to R labia with purulent head. Mild surrounding induration. No signs of cellulitis.   Musculoskeletal: Normal range of motion.  Neurological: She is alert and oriented to person, place, and time.  Skin: Skin is warm and dry.  Psychiatric: She has a normal mood and affect. Her behavior is normal.  Nursing note and vitals reviewed.  ED Course  Procedures (including critical care time) DIAGNOSTIC STUDIES: Oxygen Saturation is 98% on RA, normal by my interpretation.    COORDINATION OF CARE: 2:01 PM-Discussed treatment plan which includes I&D with pt at bedside and pt agreed to plan.   INCISION AND DRAINAGE PROCEDURE NOTE: Patient identification was confirmed and verbal consent was obtained. This procedure was performed by Arthor Captain, PA-C at 2:04 PM. Site: labia Sterile procedures observed Needle size: 27 gauge Anesthetic used (type and amt): 5 mL of bupivacaine Blade size: 11 Drainage: purulent and moderate  Complexity: Complex No packing used Site anesthetized, incision made over site, wound drained and explored loculations, rinsed with copious amounts of  normal saline, wound packed with sterile gauze, covered with dry, sterile dressing.  Pt tolerated procedure well without complications.  Instructions for care discussed verbally and pt provided with additional written instructions for homecare and f/u.  Labs Review Labs Reviewed  CBG MONITORING, ED   Imaging Review No results found. I have personally reviewed and evaluated these images and lab results as part  of my medical decision-making.   EKG Interpretation None      MDM   Final diagnoses:  Labial abscess   Patient with skin abscess amenable to incision and drainage. Wound recheck in 2 days. Encouraged home warm soaks and flushing. No signs of cellulitis in surrounding skin. Will d/c to home. No antibiotic therapy is indicated.  I personally performed the services described in this documentation, which was scribed in my presence. The recorded information has been reviewed and is accurate.      Arthor Captain, PA-C 11/16/15 1123  Alvira Monday, MD 11/17/15 [phone]

## 2015-11-14 NOTE — ED Notes (Signed)
Patient here with reported abscess to labia x 3 days, minimal drainage

## 2015-11-30 DIAGNOSIS — A419 Sepsis, unspecified organism: Principal | ICD-10-CM

## 2015-11-30 NOTE — H&P (Signed)
[facility]  [address], [address]  [place] [address]  [phone]    Office Note/H&P/Consult Note   Annibelle Brazie   MRN: [id number]     DOB: 11-Dec-1995        HPI: [name] is a 20 y.o. female who came to the ER complaining of a 5 day history of severe, progressive, non-radiating peri-anal and buttock pain.  She has no history of perirectal abscess and has no h/o diabetes or Crohn's disease.  She is s/p prior labial abscess drainage yrs ago at another facility.  She has no associated fevers.  Nothing seems to make the pain better.  It is worse with palpation.        History reviewed. No pertinent past medical history.  History reviewed. No pertinent past surgical history.  Current Facility-Administered Medications   Medication Dose Route Frequency   ??? clindamycin phosphate (CLEOCIN) 600 mg in 0.9% sodium chloride (MBP/ADV) 100 mL ADV  600 mg IntraVENous NOW   ??? lactated ringers bolus infusion 1,000 mL  1,000 mL IntraVENous ONCE   ??? lactated ringers infusion  125 mL/hr IntraVENous CONTINUOUS     No current outpatient prescriptions on file.     ALLERGIES:  Review of patient's allergies indicates no known allergies.    Social History     Social History   ??? Marital status: SINGLE     Spouse name: N/A   ??? Number of children: N/A   ??? Years of education: N/A     Social History Main Topics   ??? Smoking status: Never Smoker   ??? Smokeless tobacco: None   ??? Alcohol use None   ??? Drug use: None   ??? Sexual activity: Not Asked     Other Topics Concern   ??? None     Social History Narrative   ??? None     History   Smoking Status   ??? Never Smoker   Smokeless Tobacco   ??? Not on file     History reviewed. No pertinent family history.    ROS: The patient has no difficulty with chest pain or shortness of breath.  No fever or chills.  Comprehensive review of systems was otherwise unremarkable except as noted above.    Physical Exam:    Constitutional: Alert oriented cooperative patient in no acute distress.   Visit Vitals   ??? BP 140/84 (BP 1 Location: Right arm)   ??? Pulse (!) 145   ??? Temp 100 ??F (37.8 ??C)   ??? Resp 20   ??? Ht 5' 2" (1.575 m)   ??? Wt 220 lb (99.8 kg)   ??? SpO2 98%   ??? BMI 40.24 kg/m2     Eyes:Sclera are clear.   ENMT: no obvious neck masses, no ear or lip lesions  CV: RRR. Normal perfusion  Resp: No JVD.  Breathing is  non-labored.    GI: soft and non-distended ; morbid obesity  Large right buttock and perirectal abscess    Musculoskeletal: unremarkable with normal function.   Neuro:  No obvious focal deficits  Psychiatric: normal affect and mood, no memory impairment      Labs:  Lab Results   Component Value Date/Time    WBC 16.3 11/30/2015 07:05 PM    PLATELET 276 11/30/2015 07:05 PM    Sodium 139 11/30/2015 07:05 PM    Potassium 4.3 11/30/2015 07:05 PM    Chloride 105 11/30/2015 07:05 PM  CO2 27 09-Dec-2015 07:05 PM    BUN 5 Dec 09, 2015 07:05 PM    Creatinine 0.79 09-Dec-2015 07:05 PM    Glucose 99 2015/12/09 07:05 PM    Bilirubin, total 0.6 December 09, 2015 07:05 PM    AST (SGOT) 19 12-09-2015 07:05 PM    ALT (SGPT) 20 09-Dec-2015 07:05 PM    Alk. phosphatase 104 12/09/15 07:05 PM       No results found for this or any previous visit.      Assessment/Plan:     )Salena Ortlieb is a 20 y.o. female who has signs and symptoms consistent with the below issues:  Problem List  Date Reviewed: 12-09-15          Codes Class Noted    * (Principal)Perirectal abscess ICD-10-CM: K61.1  ICD-9-CM: 287  December 09, 2015        Leukocytosis ICD-10-CM: D72.829  ICD-9-CM: 288.60  12-09-15        Abscess of buttock ICD-10-CM: L02.31  ICD-9-CM: 682.5  2015-12-09        Morbid obesity due to excess calories [facility]) ICD-10-CM: E66.01  ICD-9-CM: 278.01  09-Dec-2015        Sepsis ([place]) ICD-10-CM: A41.9  ICD-9-CM: 038.9, 995.91  12-09-2015        Tachycardia ICD-10-CM: R00.0  ICD-9-CM: 785.0  2015-12-09               She is septic and will be admitted for IV abx and definitive RX  Lactic acid and bld cultures     I discussed the patient's condition with the patient at length and treatment options.  I discussed risks of surgery (I&D perirectal and buttock abscess) in language the patient could understand including pain, bleeding, infection, need for further surgery, recurrence, need for further surgery, blood clots, risks of anesthesia, etc.....Marland KitchenMarland KitchenThe patient voiced understanding of all this and all questions were answered.  Alternatives to surgery weres discussed also and risks of the alternatives.  The patient requested that we proceed with surgery.  Informed consent was obtained.     OR notified        Eugene Garnet, MD,  FACS

## 2015-11-30 NOTE — ED Notes (Signed)
Report called to susan RN

## 2015-11-30 NOTE — ED Provider Notes (Signed)
HPI Comments: 20 year old  Lady with a history of pain and swelling in her right perianal.   Patient says she is concerned that she has an abscess.  Patient has had previous abscesses but not in that location.  She says she has had some fevers and chills.    Elements of this note were created with speech recognition software. As such, errors of speech recognition may have occurred.      Patient is a 20 y.o. female presenting with abscess. The history is provided by the patient.   Abscess           History reviewed. No pertinent past medical history.    History reviewed. No pertinent past surgical history.      History reviewed. No pertinent family history.    Social History     Social History   ??? Marital status: SINGLE     Spouse name: N/A   ??? Number of children: N/A   ??? Years of education: N/A     Occupational History   ??? Not on file.     Social History Main Topics   ??? Smoking status: Never Smoker   ??? Smokeless tobacco: Not on file   ??? Alcohol use Not on file   ??? Drug use: Not on file   ??? Sexual activity: Not on file     Other Topics Concern   ??? Not on file     Social History Narrative   ??? No narrative on file         ALLERGIES: Review of patient's allergies indicates no known allergies.    Review of Systems   Constitutional: Positive for chills and fever. Negative for diaphoresis.   HENT: Negative for congestion, rhinorrhea and sore throat.    Eyes: Negative for redness and visual disturbance.   Respiratory: Negative for cough, chest tightness, shortness of breath and wheezing.    Cardiovascular: Negative for chest pain and palpitations.   Gastrointestinal: Negative for abdominal pain, blood in stool, diarrhea, nausea and vomiting.   Endocrine: Negative for polydipsia and polyuria.   Genitourinary: Negative for dysuria and hematuria.   Musculoskeletal: Negative for arthralgias, myalgias and neck stiffness.   Skin: Negative for rash.        abscess    Allergic/Immunologic: Negative for environmental allergies and food allergies.   Neurological: Negative for dizziness, weakness and headaches.   Hematological: Negative for adenopathy. Does not bruise/bleed easily.   Psychiatric/Behavioral: Negative for confusion and sleep disturbance. The patient is not nervous/anxious.        Vitals:    11/30/15 1902   BP: 140/84   Pulse: (!) 145   Resp: 20   Temp: 100 ??F (37.8 ??C)   SpO2: 98%   Weight: 99.8 kg (220 lb)   Height:  (1.575 m)            Physical Exam   Constitutional: She is oriented to person, place, and time. She appears well-developed and well-nourished.   HENT:   Head: Normocephalic and atraumatic.   Eyes: Conjunctivae and EOM are normal. Pupils are equal, round, and reactive to light.   Neck: Normal range of motion.   Cardiovascular: Normal rate and regular rhythm.    Pulmonary/Chest: Effort normal and breath sounds normal. No respiratory distress. She has no wheezes. She has no rales. She exhibits no tenderness.   Abdominal: Soft. Bowel sounds are normal. There is no rebound and no guarding.   Genitourinary:   Genitourinary Comments: Large  fluctuant abscess in her gluteal cleft in her perianal area especially on the right.   Musculoskeletal: Normal range of motion. She exhibits no edema or tenderness.   Lymphadenopathy:     She has no cervical adenopathy.   Neurological: She is alert and oriented to person, place, and time.   Skin: Skin is warm and dry.   Psychiatric: She has a normal mood and affect.   Nursing note and vitals reviewed.       MDM  Number of Diagnoses or Management Options  Perianal abscess:   Diagnosis management comments: I discussed the case with surgery who kindly agreed to see the patient.    ED Course       Procedures

## 2015-11-30 NOTE — Progress Notes (Signed)
TRANSFER - IN REPORT:    Verbal report received from Chris(name) on [employer]  being received from ER(unit) for routine progression of care      Report consisted of patient???s Situation, Background, Assessment and   Recommendations(SBAR).     Information from the following report(s) ED Summary was reviewed with the receiving nurse.    Opportunity for questions and clarification was provided.      Assessment completed upon patient???s arrival to unit and care assumed.     Arrived awake alert orieted x4. No skin breakdown. Menstruating. States she takes no medicines at home. Politely declines flu shot. Oriented to room and being NPO til after OR. Aunt at bedside.

## 2015-11-30 NOTE — ED Triage Notes (Signed)
Reports has an abscess on her buttocks x 4-5 days.

## 2015-12-01 ENCOUNTER — Inpatient Hospital Stay
Admit: 2015-12-01 | Discharge: 2015-12-02 | Disposition: A | Discharge status: Home / Self Care | Payer: Self-pay | Attending: Surgery | Admitting: Surgery

## 2015-12-01 LAB — METABOLIC PANEL, COMPREHENSIVE
A-G Ratio: 0.8 — ABNORMAL LOW (ref 1.2–3.5)
ALT (SGPT): 20 U/L (ref 12–65)
AST (SGOT): 19 U/L (ref 15–37)
Albumin: 4 g/dL (ref 3.5–5.0)
Alk. phosphatase: 104 U/L (ref 50–136)
Anion gap: 7 mmol/L (ref 7–16)
BUN: 5 MG/DL — ABNORMAL LOW (ref 6–23)
Bilirubin, total: 0.6 MG/DL (ref 0.2–1.1)
CO2: 27 mmol/L (ref 21–32)
Calcium: 9.4 MG/DL (ref 8.3–10.4)
Chloride: 105 mmol/L (ref 98–107)
Creatinine: 0.79 MG/DL (ref 0.6–1.0)
GFR est AA: >60 mL/min/{1.73_m2} (ref >60)
GFR est non-AA: >60 mL/min/{1.73_m2} (ref >60)
Globulin: 5 g/dL — ABNORMAL HIGH (ref 2.3–3.5)
Glucose: 99 mg/dL (ref 65–100)
Potassium: 4.3 mmol/L (ref 3.5–5.1)
Protein, total: 9 g/dL — ABNORMAL HIGH (ref 6.3–8.2)
Sodium: 139 mmol/L (ref 136–145)

## 2015-12-01 LAB — CBC WITH AUTOMATED DIFF
ABS. BASOPHILS: 0 10*3/uL (ref 0.0–0.2)
ABS. EOSINOPHILS: 0.2 10*3/uL (ref 0.0–0.8)
ABS. IMM. GRANS.: 0.1 10*3/uL (ref 0.0–0.5)
ABS. LYMPHOCYTES: 1.9 10*3/uL (ref 0.5–4.6)
ABS. MONOCYTES: 1.4 10*3/uL — ABNORMAL HIGH (ref 0.1–1.3)
ABS. NEUTROPHILS: 12.8 10*3/uL — ABNORMAL HIGH (ref 1.7–8.2)
BASOPHILS: 0 % (ref 0.0–2.0)
EOSINOPHILS: 1 % (ref 0.5–7.8)
HCT: 40.1 % (ref 35.8–46.3)
HGB: 13.2 g/dL (ref 11.7–15.4)
IMMATURE GRANULOCYTES: 0.3 % (ref 0.0–5.0)
LYMPHOCYTES: 11 % — ABNORMAL LOW (ref 13–44)
MCH: 22.5 PG — ABNORMAL LOW (ref 26.1–32.9)
MCHC: 32.9 g/dL (ref 31.4–35.0)
MCV: 68.3 FL — ABNORMAL LOW (ref 79.6–97.8)
MONOCYTES: 9 % (ref 4.0–12.0)
NEUTROPHILS: 79 % — ABNORMAL HIGH (ref 43–78)
PLATELET: 276 10*3/uL (ref 150–450)
RBC: 5.87 M/uL — ABNORMAL HIGH (ref 4.05–5.25)
RDW: 15.8 % — ABNORMAL HIGH (ref 11.9–14.6)
WBC: 16.3 10*3/uL — ABNORMAL HIGH (ref 4.3–11.1)

## 2015-12-01 LAB — POC LACTIC ACID: Lactic Acid (POC): 1 mmol/L (ref 0.5–1.9)

## 2015-12-01 LAB — HCG URINE, QL. - POC
Pregnancy test,urine (POC): NEGATIVE
Pregnancy test,urine (POC): NEGATIVE
Pregnancy test,urine (POC): NEGATIVE

## 2015-12-01 MED ORDER — OXYCODONE 5 MG TAB
5 mg | ORAL | Status: DC | PRN
Start: 2015-12-01 — End: 2015-12-02

## 2015-12-01 MED ORDER — FENTANYL CITRATE (PF) 50 MCG/ML IJ SOLN
50 mcg/mL | INTRAMUSCULAR | Status: AC
Start: 2015-12-01 — End: ?

## 2015-12-01 MED ORDER — ACETAMINOPHEN 500 MG TAB
500 mg | Freq: Four times a day (QID) | ORAL | Status: DC | PRN
Start: 2015-12-01 — End: 2015-12-02

## 2015-12-01 MED ORDER — BACITRACIN 50,000 UNIT IM
50000 unit | INTRAMUSCULAR | Status: AC
Start: 2015-12-01 — End: ?

## 2015-12-01 MED ORDER — ENOXAPARIN 40 MG/0.4 ML SUB-Q SYRINGE
40 mg/0.4 mL | Freq: Two times a day (BID) | SUBCUTANEOUS | Status: DC
Start: 2015-12-01 — End: 2015-12-02
  Administered 2015-12-02: 13:00:00 via SUBCUTANEOUS

## 2015-12-01 MED ORDER — HYDROMORPHONE (PF) 1 MG/ML IJ SOLN
1 mg/mL | Freq: Once | INTRAMUSCULAR | Status: AC
Start: 2015-12-01 — End: 2015-12-01
  Administered 2015-12-01: 18:00:00 via INTRAVENOUS

## 2015-12-01 MED ORDER — CLINDAMYCIN 600 MG/4 ML IV
6004 mg/4 mL | INTRAVENOUS | Status: AC
Start: 2015-12-01 — End: 2015-11-30
  Administered 2015-12-01: 01:00:00 via INTRAVENOUS

## 2015-12-01 MED ORDER — SODIUM CHLORIDE 0.9 % IV PIGGY BACK
4.5 gram | Freq: Four times a day (QID) | INTRAVENOUS | Status: DC
Start: 2015-12-01 — End: 2015-11-30

## 2015-12-01 MED ORDER — LACTATED RINGERS IV
INTRAVENOUS | Status: AC
Start: 2015-12-01 — End: 2015-12-02
  Administered 2015-12-01: 17:00:00 via INTRAVENOUS

## 2015-12-01 MED ORDER — LACTATED RINGERS IV
INTRAVENOUS | Status: DC
Start: 2015-12-01 — End: 2015-12-01
  Administered 2015-12-01: 20:00:00 via INTRAVENOUS

## 2015-12-01 MED ORDER — PIPERACILLIN-TAZOBACTAM 4.5 GRAM IV SOLR
4.5 gram | Freq: Three times a day (TID) | INTRAVENOUS | Status: DC
Start: 2015-12-01 — End: 2015-12-02
  Administered 2015-12-01 – 2015-12-02 (×5): via INTRAVENOUS

## 2015-12-01 MED ORDER — MORPHINE 2 MG/ML INJECTION
2 mg/mL | INTRAMUSCULAR | Status: DC | PRN
Start: 2015-12-01 — End: 2015-12-02

## 2015-12-01 MED ORDER — MORPHINE 10 MG/ML INJ SOLUTION
10 mg/ml | INTRAMUSCULAR | Status: DC | PRN
Start: 2015-12-01 — End: 2015-12-02

## 2015-12-01 MED ORDER — NALOXONE 0.4 MG/ML INJECTION
0.4 mg/mL | INTRAMUSCULAR | Status: DC | PRN
Start: 2015-12-01 — End: 2015-12-01

## 2015-12-01 MED ORDER — LIDOCAINE (PF) 20 MG/ML (2 %) IJ SOLN
20 mg/mL (2 %) | INTRAMUSCULAR | Status: DC | PRN
Start: 2015-12-01 — End: 2015-12-01
  Administered 2015-12-01: 19:00:00 via INTRAVENOUS

## 2015-12-01 MED ORDER — FENTANYL CITRATE (PF) 50 MCG/ML IJ SOLN
50 mcg/mL | INTRAMUSCULAR | Status: DC | PRN
Start: 2015-12-01 — End: 2015-12-01
  Administered 2015-12-01 (×5): via INTRAVENOUS

## 2015-12-01 MED ORDER — PIPERACILLIN-TAZOBACTAM 4.5 GRAM IV SOLR
4.5 gram | Freq: Four times a day (QID) | INTRAVENOUS | Status: DC
Start: 2015-12-01 — End: 2015-11-30

## 2015-12-01 MED ORDER — LACTATED RINGERS IV
INTRAVENOUS | Status: DC | PRN
Start: 2015-12-01 — End: 2015-12-01
  Administered 2015-12-01: 19:00:00 via INTRAVENOUS

## 2015-12-01 MED ORDER — BUPIVACAINE (PF) 0.25 % (2.5 MG/ML) IJ SOLN
0.25 % (2.5 mg/mL) | INTRAMUSCULAR | Status: AC
Start: 2015-12-01 — End: ?

## 2015-12-01 MED ORDER — LACTATED RINGERS IV
INTRAVENOUS | Status: DC
Start: 2015-12-01 — End: 2015-12-02
  Administered 2015-12-01: 11:00:00 via INTRAVENOUS

## 2015-12-01 MED ORDER — LACTATED RINGERS BOLUS IV
Freq: Once | INTRAVENOUS | Status: AC
Start: 2015-12-01 — End: 2015-11-30
  Administered 2015-12-01: 01:00:00 via INTRAVENOUS

## 2015-12-01 MED ORDER — SODIUM CHLORIDE 0.9 % IV PIGGY BACK
1000 mg | Freq: Two times a day (BID) | INTRAVENOUS | Status: DC
Start: 2015-12-01 — End: 2015-11-30

## 2015-12-01 MED ORDER — ADV ADDAPTOR
1000 mg | Freq: Three times a day (TID) | Status: DC
Start: 2015-12-01 — End: 2015-12-02
  Administered 2015-12-01 – 2015-12-02 (×5): via INTRAVENOUS

## 2015-12-01 MED ORDER — FAMOTIDINE 20 MG TAB
20 mg | Freq: Once | ORAL | Status: AC
Start: 2015-12-01 — End: 2015-12-02
  Administered 2015-12-01: 18:00:00 via ORAL

## 2015-12-01 MED ORDER — LIDOCAINE HCL 1 % (10 MG/ML) IJ SOLN
10 mg/mL (1 %) | INTRAMUSCULAR | Status: DC | PRN
Start: 2015-12-01 — End: 2015-12-02

## 2015-12-01 MED ORDER — OXYCODONE 5 MG TAB
5 mg | ORAL | Status: DC | PRN
Start: 2015-12-01 — End: 2015-12-02
  Administered 2015-12-02 (×2): via ORAL

## 2015-12-01 MED ORDER — MIDAZOLAM 1 MG/ML IJ SOLN
1 mg/mL | Freq: Once | INTRAMUSCULAR | Status: DC | PRN
Start: 2015-12-01 — End: 2015-12-02

## 2015-12-01 MED ORDER — HYDROMORPHONE (PF) 2 MG/ML IJ SOLN
2 mg/mL | INTRAMUSCULAR | Status: DC | PRN
Start: 2015-12-01 — End: 2015-12-01

## 2015-12-01 MED ORDER — OXYCODONE-ACETAMINOPHEN 5 MG-325 MG TAB
5-325 mg | ORAL | Status: DC | PRN
Start: 2015-12-01 — End: 2015-12-01

## 2015-12-01 MED ORDER — MORPHINE 4 MG/ML SYRINGE
4 mg/mL | INTRAMUSCULAR | Status: DC | PRN
Start: 2015-12-01 — End: 2015-12-02
  Administered 2015-12-01 – 2015-12-02 (×4): via INTRAVENOUS

## 2015-12-01 MED ORDER — FAMOTIDINE (PF) 20 MG/2 ML IV
20 mg/2 mL | Freq: Two times a day (BID) | INTRAVENOUS | Status: DC
Start: 2015-12-01 — End: 2015-12-02
  Administered 2015-12-01 – 2015-12-02 (×4): via INTRAVENOUS

## 2015-12-01 MED ORDER — LACTATED RINGERS IV
INTRAVENOUS | Status: DC
Start: 2015-12-01 — End: 2015-11-30

## 2015-12-01 MED ORDER — ONDANSETRON (PF) 4 MG/2 ML INJECTION
4 mg/2 mL | INTRAMUSCULAR | Status: DC | PRN
Start: 2015-12-01 — End: 2015-12-02

## 2015-12-01 MED ORDER — PROPOFOL 10 MG/ML IV EMUL
10 mg/mL | INTRAVENOUS | Status: DC | PRN
Start: 2015-12-01 — End: 2015-12-01
  Administered 2015-12-01: 19:00:00 via INTRAVENOUS

## 2015-12-01 MED ORDER — SODIUM CHLORIDE 0.9 % IJ SYRG
INTRAMUSCULAR | Status: DC | PRN
Start: 2015-12-01 — End: 2015-12-01

## 2015-12-01 MED ORDER — BACITRACIN 50,000 UNIT IM
50000 unit | INTRAMUSCULAR | Status: DC | PRN
Start: 2015-12-01 — End: 2015-12-01
  Administered 2015-12-01: 19:00:00

## 2015-12-01 MED ORDER — BUPIVACAINE (PF) 0.25 % (2.5 MG/ML) IJ SOLN
0.25 % (2.5 mg/mL) | INTRAMUSCULAR | Status: DC | PRN
Start: 2015-12-01 — End: 2015-12-01
  Administered 2015-12-01: 20:00:00 via SUBCUTANEOUS

## 2015-12-01 MED FILL — FENTANYL CITRATE (PF) 50 MCG/ML IJ SOLN: 50 mcg/mL | INTRAMUSCULAR | Qty: 2

## 2015-12-01 MED FILL — PIPERACILLIN-TAZOBACTAM 4.5 GRAM IV SOLR: 4.5 gram | INTRAVENOUS | Qty: 4.5

## 2015-12-01 MED FILL — VANCOMYCIN 1,000 MG IV SOLR: 1000 mg | INTRAVENOUS | Qty: 1000

## 2015-12-01 MED FILL — MARCAINE (PF) 0.25 % (2.5 MG/ML) INJECTION SOLUTION: 0.25 % (2.5 mg/mL) | INTRAMUSCULAR | Qty: 30

## 2015-12-01 MED FILL — MORPHINE 4 MG/ML SYRINGE: 4 mg/mL | INTRAMUSCULAR | Qty: 1

## 2015-12-01 MED FILL — LACTATED RINGERS IV: INTRAVENOUS | Qty: 1000

## 2015-12-01 MED FILL — FAMOTIDINE (PF) 20 MG/2 ML IV: 20 mg/2 mL | INTRAVENOUS | Qty: 2

## 2015-12-01 MED FILL — CLINDAMYCIN 600 MG/4 ML IV: 600 mg/4 mL | INTRAVENOUS | Qty: 4

## 2015-12-01 MED FILL — HYDROMORPHONE (PF) 1 MG/ML IJ SOLN: 1 mg/mL | INTRAMUSCULAR | Qty: 1

## 2015-12-01 MED FILL — BACITRACIN 50,000 UNIT IM: 50000 unit | INTRAMUSCULAR | Qty: 50000

## 2015-12-01 NOTE — Op Note (Signed)
[facility]   OPERATIVE REPORT       Name:  Patricia Mcdonald, Patricia Mcdonald   MR#:  [id number]   DOB:  1996-07-14   Account #:  [id number]   Date of Adm:  11/30/2015       DATE OF SURGERY: 12/01/2015    PREOPERATIVE DIAGNOSES:   1. Morbid obesity.   2. Sepsis.   3. Leukocytosis.   4. Tachycardia.   5. Acute necrotizing infection with pelvirectal abscess.    POSTOPERATIVE DIAGNOSES:   1. Morbid obesity.   2. Sepsis.   3. Leukocytosis.   4. Tachycardia.   5. Acute necrotizing infection with pelvirectal abscess.    PROCEDURE:    1. Sharp excisional debridement of necrotizing skin and soft   tissue infection involving the external genitalia and perineum   (skin, subcu, and fascia).   2. Incision and drainage of deep pelvirectal abscess.     SURGEON: Tyrone Apple. Marrian Salvage, MD.    ASSISTANTS: None.    ANESTHESIA: General.    COMPLICATIONS: None.    ESTIMATED BLOOD LOSS: Less than 100 mL.    SPECIMENS: Culture sent to microbiology.    OPERATIVE PROCEDURE: Patricia Mcdonald was taken to the operating room,   placed in the supine position and after adequate anesthesia, she   was positioned in candy cane stirrups. The perineum and   perirectal region as well as her external genitalia were prepped   and draped in a sterile fashion with multiple layers of Betadine   scrub and Betadine solution. Exposure was incredibly difficult   as a result of the patient's morbid obesity. She had a very   large rapidly progressive infection involving her right   perirectal, pelvirectal, and genitalia.     An oblique incision was made with a scalpel after time-out   protocol was followed. Pus was evacuated and the culture was   sent to microbiology. We dissected and unroofed the abscess   cavity by using curved Mayo scissors and a scalpel to create an   opening. This was a very large abscess which extended quite deep   into the ischial region and up towards the right labia. We   evacuated and drained the deep pelvirectal abscess. We then    performed sharp excisional debridement to remove devitalized and   necrotizing skin, subcutaneous tissue and fascia. We cleared the   wound of devitalized tissues. There was quite a massive opening.   It was irrigated. We could not find a fistula tract. It was   packed with Kerlix.     Patricia Mcdonald was taken to recovery in good condition. A local   anesthetic had been given at the end of the case.        Donia Guiles, MD      MAT / [place]   D:  12/02/2015   19:03   T:  12/03/2015   08:25   Job #:  [id number]

## 2015-12-01 NOTE — Progress Notes (Signed)
Pharmacokinetic Consult to Pharmacist    Patricia Mcdonald is a 20 y.o. female being treated for perirectal abscess and sepsis with vancomycin and zosyn.    Height:  (157.5 cm)  Weight: 99.8 kg (220 lb)  Lab Results   Component Value Date/Time    BUN 5 11/30/2015 07:05 PM    Creatinine 0.79 11/30/2015 07:05 PM    WBC 16.3 11/30/2015 07:05 PM      Estimated Creatinine Clearance: 124.5 mL/min (based on Cr of 0.79).    CULTURES:  Pending.    Day 1 of vancomycin.  Goal trough is 15-20.  Vancomycin dose initiated at 1g q 8h for 19yo.   Will obtain trough prior to 4th dose.  Will continue to follow patient.      Thank you,  Andree Elk, PharmD  Clinical Pharmacist

## 2015-12-01 NOTE — Anesthesia Post-Procedure Evaluation (Signed)
Post-Anesthesia Evaluation and Assessment    Patient: Patricia Mcdonald MRN: [id number]  SSN: [id number]    Date of Birth: 1996/06/18  Age: 20 y.o.  Sex: female       Cardiovascular Function/Vital Signs  Visit Vitals   ??? BP 132/73   ??? Pulse 100   ??? Temp 36.9 ??C (98.5 ??F)   ??? Resp 15   ??? Ht  (1.575 m)   ??? Wt 99.8 kg (220 lb)   ??? SpO2 97%   ??? BMI 40.24 kg/m2       Patient is status post general anesthesia for Procedure(s):  RECTAL EXAM UNDER ANESTHESIA, Peri Rectal abcess.    Nausea/Vomiting: None    Postoperative hydration reviewed and adequate.    Pain:  Pain Scale 1: Numeric (0 - 10) (12/01/15 1443)  Pain Intensity 1: 0 (12/01/15 1443)   Managed    Neurological Status:   Neuro (WDL): Within Defined Limits (12/01/15 1443)   At baseline    Mental Status and Level of Consciousness: Arousable    Pulmonary Status:   O2 Device: Nasal cannula (12/01/15 1443)   Adequate oxygenation and airway patent    Complications related to anesthesia: None    Post-anesthesia assessment completed. No concerns    Signed By: Deitra Mayo, MD     December 01, 2015

## 2015-12-01 NOTE — Progress Notes (Signed)
End of Shift Note:    Voiding: Yes  Stool:  No  Passing Flatus: No  Emesis: No  Ambulates: Yes

## 2015-12-01 NOTE — Anesthesia Pre-Procedure Evaluation (Addendum)
Anesthetic History   No history of anesthetic complications            Review of Systems / Medical History  Patient summary reviewed, nursing notes reviewed and pertinent labs reviewed    Pulmonary      Recent URI             Neuro/Psych   Within defined limits           Cardiovascular                  Exercise tolerance: >4 METS     GI/Hepatic/Renal  Within defined limits              Endo/Other        Morbid obesity     Other Findings            Physical Exam    Airway  Mallampati: II  TM Distance: 4 - 6 cm  Neck ROM: normal range of motion   Mouth opening: Normal     Cardiovascular    Rhythm: regular           Dental  No notable dental hx       Pulmonary  Breath sounds clear to auscultation               Abdominal         Other Findings            Anesthetic Plan    ASA: 2  Anesthesia type: general          Induction: Intravenous  Anesthetic plan and risks discussed with: Patient

## 2015-12-01 NOTE — Other (Signed)
TRANSFER - IN REPORT:    Verbal report received from Darl Pikes, RN on [employer]  being received from 2nd floor for ordered procedure      Report consisted of patient???s Situation, Background, Assessment and   Recommendations(SBAR).     Information from the following report(s) Kardex, MAR and Recent Results was reviewed with the receiving nurse.    Opportunity for questions and clarification was provided.      Assessment completed upon patient???s arrival to unit and care assumed.

## 2015-12-01 NOTE — Progress Notes (Signed)
[facility]  [address], [address]  [place] [address]  [phone]    Office Note/H&P/Consult Note   Patricia Mcdonald   MRN: [id number]     DOB: 04/09/1996        HPI: Patricia Mcdonald is a 20 y.o. female who came to the ER complaining of a 5 day history of severe, progressive, non-radiating peri-anal and buttock pain.  She has no history of perirectal abscess and has no h/o diabetes or Crohn's disease.  She is s/p prior labial abscess drainage yrs ago at another facility.  She has no associated fevers.  Nothing seems to make the pain better.  It is worse with palpation.        History reviewed. No pertinent past medical history.  History reviewed. No pertinent past surgical history.  Current Facility-Administered Medications   Medication Dose Route Frequency   ??? lidocaine (XYLOCAINE) 10 mg/mL (1 %) injection 0.1 mL  0.1 mL SubCUTAneous PRN   ??? lactated ringers infusion  75 mL/hr IntraVENous CONTINUOUS   ??? famotidine (PEPCID) tablet 20 mg  20 mg Oral ONCE   ??? midazolam (VERSED) injection 2 mg  2 mg IntraVENous ONCE PRN   ??? lactated ringers infusion  150 mL/hr IntraVENous CONTINUOUS   ??? famotidine (PF) (PEPCID) injection 20 mg  20 mg IntraVENous Q12H   ??? [START ON 12/02/2015] enoxaparin (LOVENOX) injection 40 mg  40 mg SubCUTAneous Q12H   ??? morphine injection 6 mg  6 mg IntraVENous Q1H PRN   ??? oxyCODONE IR (ROXICODONE) tablet 10 mg  10 mg Oral Q4H PRN   ??? acetaminophen (TYLENOL) tablet 1,000 mg  1,000 mg Oral Q6H PRN   ??? ondansetron (ZOFRAN) injection 4 mg  4 mg IntraVENous Q4H PRN   ??? vancomycin (VANCOCIN) 1,000 mg in 0.9% sodium chloride (MBP/ADV) 250 mL  1,000 mg IntraVENous Q8H   ??? oxyCODONE IR (ROXICODONE) tablet 5 mg  5 mg Oral Q4H PRN   ??? morphine injection 2 mg  2 mg IntraVENous Q1H PRN   ??? morphine injection 4 mg  4 mg IntraVENous Q1H PRN   ??? piperacillin-tazobactam (ZOSYN) 4.5 g in 0.9% sodium chloride (MBP/ADV) 100 mL  4.5 g IntraVENous Q8H      ALLERGIES:  Review of patient's allergies indicates no known allergies.    Social History     Social History   ??? Marital status: SINGLE     Spouse name: N/A   ??? Number of children: N/A   ??? Years of education: N/A     Social History Main Topics   ??? Smoking status: Never Smoker   ??? Smokeless tobacco: None   ??? Alcohol use None   ??? Drug use: None   ??? Sexual activity: Not Asked     Other Topics Concern   ??? None     Social History Narrative   ??? None     History   Smoking Status   ??? Never Smoker   Smokeless Tobacco   ??? Not on file     History reviewed. No pertinent family history.    ROS: The patient has no difficulty with chest pain or shortness of breath.  No fever or chills.  Comprehensive review of systems was otherwise unremarkable except as noted above.    Physical Exam:   Constitutional: Alert oriented cooperative patient in no acute distress.   Visit Vitals   ??? BP 126/74 (BP 1 Location: Right arm, BP Patient Position:  At rest)   ??? Pulse (!) 103   ??? Temp 98.6 ??F (37 ??C)   ??? Resp 20   ??? Ht 5' 2"  (1.575 m)   ??? Wt 220 lb (99.8 kg)   ??? SpO2 100%   ??? BMI 40.24 kg/m2     Eyes:Sclera are clear.   ENMT: no obvious neck masses, no ear or lip lesions  CV: RRR. Normal perfusion  Resp: No JVD.  Breathing is  non-labored.    GI: soft and non-distended ; morbid obesity  Large right buttock and perirectal abscess    Musculoskeletal: unremarkable with normal function.   Neuro:  No obvious focal deficits  Psychiatric: normal affect and mood, no memory impairment      Labs:  Lab Results   Component Value Date/Time    WBC 16.3 12/21/15 07:05 PM    PLATELET 276 12-21-15 07:05 PM    Sodium 139 2015-12-21 07:05 PM    Potassium 4.3 21-Dec-2015 07:05 PM    Chloride 105 12-21-15 07:05 PM    CO2 27 December 21, 2015 07:05 PM    BUN 5 2015-12-21 07:05 PM    Creatinine 0.79 12/21/2015 07:05 PM    Glucose 99 December 21, 2015 07:05 PM    Bilirubin, total 0.6 2015-12-21 07:05 PM    AST (SGOT) 19 2015-12-21 07:05 PM    ALT (SGPT) 20 December 21, 2015 07:05 PM     Alk. phosphatase 104 Dec 21, 2015 07:05 PM       No results found for this or any previous visit.      Assessment/Plan:     )Patricia Mcdonald is a 20 y.o. female who has signs and symptoms consistent with the below issues:  Problem List  Date Reviewed: 2015-12-21          Codes Class Noted    * (Principal)Perirectal abscess ICD-10-CM: K61.1  ICD-9-CM: 220  2015/12/21        Leukocytosis ICD-10-CM: D72.829  ICD-9-CM: 288.60  12/21/2015        Abscess of buttock ICD-10-CM: L02.31  ICD-9-CM: 682.5  2015-12-21        Morbid obesity due to excess calories ([place]) ICD-10-CM: E66.01  ICD-9-CM: 278.01  December 21, 2015        Sepsis ([place]) ICD-10-CM: A41.9  ICD-9-CM: 038.9, 995.91  12-21-15        Tachycardia ICD-10-CM: R00.0  ICD-9-CM: 785.0  2015/12/21        Necrotizing soft tissue infection ([place]) ICD-10-CM: M72.6  ICD-9-CM: 728.86  12-21-2015              She was admitted with sepsis for IV abx and definitive RX  Lactic acid was 1.0 and bld cultures x 2 drawn last night are still negative x 9-10 hrs  We couldn't drain her infection because she ate a pizza just before coming to our ER   and anesthesia didn't think prudent to proceed last night.     I discussed the patient's condition with the patient at length and treatment options.  I discussed risks of surgery (I&D perirectal and buttock abscess) in language the patient could understand including pain, bleeding, infection, need for further surgery, recurrence, need for further surgery, blood clots, risks of anesthesia, etc.....Marland KitchenMarland KitchenThe patient voiced understanding of all this and all questions were answered.  Alternatives to surgery weres discussed also and risks of the alternatives.  The patient requested that we proceed with surgery.  Informed consent was obtained.         Eugene Garnet, MD,  FACS

## 2015-12-01 NOTE — Brief Op Note (Addendum)
BRIEF OPERATIVE NOTE    Date of Procedure:  12/01/2015    Preoperative Diagnosis:   Sepsis  Morbid Obesity  Pelvirectal abcess  Postoperative Diagnosis: same    Procedure:   Debridement and drainage of right perirectal abscess - see full op note  Surgeon(s) and Role:     * Donia Guiles, MD - Primary  Anesthesia: General  Estimated Blood Loss: <30cc  Specimens:   ID Type Source Tests Collected by Time Destination   1 :  Wound Perirectal CULTURE, ANAEROBIC, CULTURE, WOUND W GRAM STAIN Donia Guiles, MD 12/01/2015 1418 Microbiology     Findings: see op note   Complications: none  Implants: * No implants in log *

## 2015-12-01 NOTE — Other (Signed)
TRANSFER - OUT REPORT:    Verbal report given to Lynette(name) on [employer]  being transferred to 2nd(unit) for routine post - op       Report consisted of patient???s Situation, Background, Assessment and   Recommendations(SBAR).     Information from the following report(s) Kardex, OR Summary, Procedure Summary and MAR was reviewed with the receiving nurse.    Lines:   Peripheral IV 11/30/15 Right Antecubital (Active)   Site Assessment Clean, dry, & intact 12/01/2015  1:53 AM   Phlebitis Assessment 0 12/01/2015  1:53 AM   Infiltration Assessment 0 12/01/2015  1:53 AM   Dressing Status Clean, dry, & intact 12/01/2015  1:53 AM   Dressing Type Transparent 12/01/2015  1:53 AM   Hub Color/Line Status Infusing 12/01/2015  1:53 AM       Peripheral IV 11/30/15 Left Arm (Active)   Site Assessment Clean, dry, & intact 12/01/2015  2:48 PM   Phlebitis Assessment 0 12/01/2015  2:48 PM   Infiltration Assessment 0 12/01/2015  2:48 PM   Dressing Status Clean, dry, & intact 12/01/2015  2:48 PM   Dressing Type Transparent 12/01/2015  2:48 PM   Hub Color/Line Status Infusing 12/01/2015  1:53 AM        Opportunity for questions and clarification was provided.      Patient transported with:   O2 @ 0 liters    VTE prophylaxis orders have been written for [employer].     Patient given room number and nurses name.  Family updated re: pt status after security code verified.

## 2015-12-01 NOTE — Progress Notes (Signed)
TRANSFER - IN REPORT:    Verbal report received from Jane, RN on [employer]  being received from [facility] for routine progression of care      Report consisted of patient???s Situation, Background, Assessment and   Recommendations(SBAR).     Information from the following report(s) Kardex was reviewed with the receiving nurse.    Opportunity for questions and clarification was provided.      Assessment completed upon patient???s arrival to unit and care assumed.

## 2015-12-02 LAB — VANCOMYCIN, TROUGH: Vancomycin,trough: 10.5 ug/mL (ref 5–20)

## 2015-12-02 MED FILL — PIPERACILLIN-TAZOBACTAM 4.5 GRAM IV SOLR: 4.5 gram | INTRAVENOUS | Qty: 4.5

## 2015-12-02 MED FILL — LACTATED RINGERS IV: INTRAVENOUS | Qty: 1000

## 2015-12-02 MED FILL — VANCOMYCIN 1,000 MG IV SOLR: 1000 mg | INTRAVENOUS | Qty: 1000

## 2015-12-02 MED FILL — OXYCODONE 5 MG TAB: 5 mg | ORAL | Qty: 2

## 2015-12-02 MED FILL — FAMOTIDINE (PF) 20 MG/2 ML IV: 20 mg/2 mL | INTRAVENOUS | Qty: 2

## 2015-12-02 MED FILL — XYLOCAINE-MPF 20 MG/ML (2 %) INJECTION SOLUTION: 20 mg/mL (2 %) | INTRAMUSCULAR | Qty: 60

## 2015-12-02 MED FILL — LOVENOX 40 MG/0.4 ML SUBCUTANEOUS SYRINGE: 40 mg/0.4 mL | SUBCUTANEOUS | Qty: 0.4

## 2015-12-02 MED FILL — MORPHINE 4 MG/ML SYRINGE: 4 mg/mL | INTRAMUSCULAR | Qty: 1

## 2015-12-02 MED FILL — PROPOFOL 10 MG/ML IV EMUL: 10 mg/mL | INTRAVENOUS | Qty: 200

## 2015-12-02 NOTE — Progress Notes (Signed)
Pharmacokinetic Consult to Pharmacist    Patricia Mcdonald is a 20 y.o. female being treated for perirectal abscess and sepsis with vancomycin and zosyn.    Height:  (157.5 cm)  Weight: 99.8 kg (220 lb)  Lab Results   Component Value Date/Time    BUN 5 11/30/2015 07:05 PM    Creatinine 0.79 11/30/2015 07:05 PM    WBC 16.3 11/30/2015 07:05 PM      Estimated Creatinine Clearance: 124.5 mL/min (based on Cr of 0.79).    CULTURES:  Pending.    Lab Results   Component Value Date/Time    Vancomycin,trough 10.5 12/01/2015 10:40 PM     Day 3 of vancomycin.  Goal trough is 15-20.  Vancomycin dose initiated at 1g q 8h for 19yo.   Trough below, but near goal.  Plan is for discharge today after dressing change and to use PO bactrim with outpatient follow up..  Will continue to follow patient.      Thank you,  Alvester Chou, PharmD, BCPS  Clinical Pharmacist  [phone]

## 2015-12-02 NOTE — Progress Notes (Signed)
Sacral dressing changed as ordered.  Large soaked kerlix with serosanguinous drainage removed. Small amount of serosanguinous drainage noted. Dressing replaced with  Saline-moistened packing in the gluteal area. Pt tolerated well. Will monitor.

## 2015-12-02 NOTE — Progress Notes (Addendum)
Spoke to Ms. Patricia Mcdonald in room 211 about discharge planning.  She and her mother just moved to [place], [place] from [place], [place], and are living with their aunt/sister.  Agreed to [facility] RN to help assist with perirectal wound.  Referral, home health orders, and face-to-face placed into [facility].  Also discussed getting a PCP and discussed [employer] and [employer].

## 2015-12-02 NOTE — Progress Notes (Signed)
Pt's D/C instructions completed.  Verbalized understanding of all instructions including diet, activity, s/sx to alert MD, medications, wound care, and f/u appointment. Patient receiving ABT then discharge at that.

## 2015-12-02 NOTE — Progress Notes (Signed)
Awaiting for [facility] to see patient, to see if patient to go home per MD orders.

## 2015-12-02 NOTE — Op Note (Signed)
[facility]   OPERATIVE REPORT       Name:  Patricia Mcdonald, Patricia Mcdonald   MR#:  [id number]   DOB:  06/22/1996   Account #:  [id number]   Date of Adm:  11/30/2015       DATE OF SURGERY: 12/01/2015    PREOPERATIVE DIAGNOSES:   1. Morbid obesity.   2. Sepsis.   3. Leukocytosis.   4. Tachycardia.   5. Acute necrotizing infection with pelvirectal abscess.    POSTOPERATIVE DIAGNOSES:   1. Morbid obesity.   2. Sepsis.   3. Leukocytosis.   4. Tachycardia.   5. Acute necrotizing infection with pelvirectal abscess.    PROCEDURE:    1. Sharp excisional debridement of necrotizing skin and soft   tissue infection involving the external genitalia and perineum   (skin, subcu, and fascia).   2. Incision and drainage of deep pelvirectal abscess.     SURGEON: Anthon Harpole A. Tujuana Kilmartin, MD.    ASSISTANTS: None.    ANESTHESIA: General.    COMPLICATIONS: None.    ESTIMATED BLOOD LOSS: Less than 100 mL.    SPECIMENS: Culture sent to microbiology.    OPERATIVE PROCEDURE: Ms. Patricia Mcdonald was taken to the operating room,   placed in the supine position and after adequate anesthesia, she   was positioned in candy cane stirrups. The perineum and   perirectal region as well as her external genitalia were prepped   and draped in a sterile fashion with multiple layers of Betadine   scrub and Betadine solution. Exposure was incredibly difficult   as a result of the patient's morbid obesity. She had a very   large rapidly progressive infection involving her right   perirectal, pelvirectal, and genitalia.     An oblique incision was made with a scalpel after time-out   protocol was followed. Pus was evacuated and the culture was   sent to microbiology. We dissected and unroofed the abscess   cavity by using curved Mayo scissors and a scalpel to create an   opening. This was a very large abscess which extended quite deep   into the ischial region and up towards the right labia. We   evacuated and drained the deep pelvirectal abscess. We then    performed sharp excisional debridement to remove devitalized and   necrotizing skin, subcutaneous tissue and fascia. We cleared the   wound of devitalized tissues. There was quite a massive opening.   It was irrigated. We could not find a fistula tract. It was   packed with Kerlix.     Ms. Patricia Mcdonald was taken to recovery in good condition. A local   anesthetic had been given at the end of the case.        Lorren Splawn A. Takiera Mayo, MD      MAT / [place]   D:  12/02/2015   19:03   T:  12/03/2015   08:25   Job #:  [id number]

## 2015-12-02 NOTE — Progress Notes (Signed)
[facility]  Face to Face Encounter    Patient???s Name: Patricia Mcdonald    Date of Birth: 1995/11/01    Ordering Physician: Dr. Marrian Salvage, MD     Primary Diagnosis: Perirectal abscess  Peri rectal abcess    Date of Face to Face:   12/02/2015                                  Face to Face Encounter findings are related to primary reason for home care:   yes.     1. I certify that the patient needs intermittent care as follows: skilled nursing care:  teaching/training of perirectal wound care     2. I certify that this patient is homebound, that is: 1) patient requires the use of a no assistive device device, special transportation, or assistance of another to leave the home; or 2) patient's condition makes leaving the home medically contraindicated; and 3) patient has a normal inability to leave the home and leaving the home requires considerable and taxing effort.  Patient may leave the home for infrequent and short duration for medical reasons, and occasional absences for non-medical reasons. Homebound status is due to the following functional limitations: Patient currently under activity restrictions secondary to recent surgical procedure, this hinders their ability to safely leave the home.    3. I certify that this patient is under my care and that I, or a nurse practitioner or physician???s assistant, or clinical nurse specialist, or certified nurse midwife, working with me, had a Face-to-Face Encounter that meets the physician Face-to-Face Encounter requirements.  The following are the clinical findings from the Face-to-Face encounter that support the need for skilled services and is a summary of the encounter: see discharge summary    See discharge summary      Jeannie Fend, RN  12/02/2015      THE FOLLOWING TO BE COMPLETED BY THE COMMUNITY PHYSICIAN:    I concur with the findings described above from the F2F encounter that this patient is homebound and in need of a skilled service.     Certifying Physician: _____________________________________      Printed Certifying Physician Name: _____________________________________    Date: _________________

## 2015-12-02 NOTE — Progress Notes (Addendum)
[facility]  [address], [address]  [place] [address]  [phone]    Office Note/H&P/Consult Note   Keiley Levey   MRN: [id number]     DOB: June 07, 1996        HPI: [name] is a 20 y.o. female who came to the ER complaining of a 5 day history of severe, progressive, non-radiating peri-anal and buttock pain.  She has no history of perirectal abscess and has no h/o diabetes or Crohn's disease.  She is s/p prior labial abscess drainage yrs ago at another facility.  She had no associated fevers.  Nothing seems to make the pain better.  It was worse with palpation.      She was taken to surgery on 12/01/15 and had drainage of her abscess.    History reviewed. No pertinent past medical history.  History reviewed. No pertinent past surgical history.  Current Facility-Administered Medications   Medication Dose Route Frequency   ??? lidocaine (XYLOCAINE) 10 mg/mL (1 %) injection 0.1 mL  0.1 mL SubCUTAneous PRN   ??? lactated ringers infusion  75 mL/hr IntraVENous CONTINUOUS   ??? midazolam (VERSED) injection 2 mg  2 mg IntraVENous ONCE PRN   ??? famotidine (PF) (PEPCID) injection 20 mg  20 mg IntraVENous Q12H   ??? enoxaparin (LOVENOX) injection 40 mg  40 mg SubCUTAneous Q12H   ??? morphine injection 6 mg  6 mg IntraVENous Q1H PRN   ??? oxyCODONE IR (ROXICODONE) tablet 10 mg  10 mg Oral Q4H PRN   ??? acetaminophen (TYLENOL) tablet 1,000 mg  1,000 mg Oral Q6H PRN   ??? ondansetron (ZOFRAN) injection 4 mg  4 mg IntraVENous Q4H PRN   ??? vancomycin (VANCOCIN) 1,000 mg in 0.9% sodium chloride (MBP/ADV) 250 mL  1,000 mg IntraVENous Q8H   ??? oxyCODONE IR (ROXICODONE) tablet 5 mg  5 mg Oral Q4H PRN   ??? morphine injection 2 mg  2 mg IntraVENous Q1H PRN   ??? morphine injection 4 mg  4 mg IntraVENous Q1H PRN   ??? piperacillin-tazobactam (ZOSYN) 4.5 g in 0.9% sodium chloride (MBP/ADV) 100 mL  4.5 g IntraVENous Q8H     ALLERGIES:  Review of patient's allergies indicates no known allergies.    Social History      Social History   ??? Marital status: SINGLE     Spouse name: N/A   ??? Number of children: N/A   ??? Years of education: N/A     Social History Main Topics   ??? Smoking status: Never Smoker   ??? Smokeless tobacco: None   ??? Alcohol use None   ??? Drug use: None   ??? Sexual activity: Not Asked     Other Topics Concern   ??? None     Social History Narrative     History   Smoking Status   ??? Never Smoker   Smokeless Tobacco   ??? Not on file     History reviewed. No pertinent family history.    ROS: The patient has no difficulty with chest pain or shortness of breath.  No fever or chills.  Comprehensive review of systems was otherwise unremarkable except as noted above.    Physical Exam:     Visit Vitals   ??? BP 131/80   ??? Pulse (!) 111   ??? Temp 99.6 ??F (37.6 ??C)   ??? Resp 20   ??? Ht 5' 2"  (1.575 m)   ??? Wt 220 lb (99.8  kg)   ??? SpO2 98%   ??? BMI 40.24 kg/m2     (from 12/01/15)  Constitutional: Alert oriented cooperative patient in no acute distress.   Eyes:Sclera are clear.   ENMT: no obvious neck masses, no ear or lip lesions  CV: RRR. Normal perfusion  Resp: No JVD.  Breathing is  non-labored.    GI: soft and non-distended ; morbid obesity  Packed buttock    Musculoskeletal: unremarkable with normal function.   Neuro:  No obvious focal deficits  Psychiatric: normal affect and mood, no memory impairment      Labs:  Lab Results   Component Value Date/Time    WBC 16.3 Dec 06, 2015 07:05 PM    PLATELET 276 12-06-15 07:05 PM    Sodium 139 12-06-15 07:05 PM    Potassium 4.3 12-06-15 07:05 PM    Chloride 105 12-06-2015 07:05 PM    CO2 27 December 06, 2015 07:05 PM    BUN 5 12-06-15 07:05 PM    Creatinine 0.79 12-06-15 07:05 PM    Glucose 99 December 06, 2015 07:05 PM    Bilirubin, total 0.6 2015-12-06 07:05 PM    AST (SGOT) 19 2015/12/06 07:05 PM    ALT (SGPT) 20 12-06-15 07:05 PM    Alk. phosphatase 104 12/06/15 07:05 PM       No results found for this or any previous visit.       Assessment/Plan:     )Isaac Lacson is a 20 y.o. female who has signs and symptoms consistent with the below issues:  Problem List  Date Reviewed: December 06, 2015          Codes Class Noted    * (Principal)Perirectal abscess ICD-10-CM: K61.1  ICD-9-CM: 099  2015/12/06        Leukocytosis ICD-10-CM: D72.829  ICD-9-CM: 288.60  Dec 06, 2015        Abscess of buttock ICD-10-CM: L02.31  ICD-9-CM: 682.5  12/06/2015        Morbid obesity due to excess calories [facility]) ICD-10-CM: E66.01  ICD-9-CM: 278.01  12/06/2015        Sepsis ([place]) ICD-10-CM: A41.9  ICD-9-CM: 038.9, 995.91  12-06-15        Tachycardia ICD-10-CM: R00.0  ICD-9-CM: 785.0  12-06-2015        Necrotizing soft tissue infection ([place]) ICD-10-CM: M72.6  ICD-9-CM: 728.86  06-Dec-2015              She was admitted with sepsis for IV abx and definitive RX  Lactic acid was 1.0 and bld cultures x 2 drawn that are negative so far  We couldn't drain her infection on admission because she ate a pizza just before coming to our ER   and anesthesia didn't think prudent to proceed that night.    She was completely drained on 12/01/15.  Wound cultures pending  IV Vanc/Zosyn as inpt--->Bactrim as an oupt    Plan is for her to be discharged later today after dressindg change   if home health care can be set up to start Tuesday for wound care.       Eugene Garnet, MD,  FACS

## 2015-12-02 NOTE — Progress Notes (Signed)
END OF SHIFT NOTE:    INTAKE/OUTPUT  02/05 0701 - 02/06 0700  In: 2296 [P.O.:360; I.V.:1936]  Out: 1000 [Urine:1000]  Voiding: YES  Catheter: NO  Drain:              Flatus: Patient does have flatus present.    Stool:  0 occurrences.    Characteristics:       Emesis: 0 occurrences.    Characteristics:        VITAL SIGNS  Patient Vitals for the past 12 hrs:   Temp Pulse Resp BP SpO2   12/02/15 0717 99.3 ??F (37.4 ??C) 98 18 116/70 96 %   12/02/15 0000 98.5 ??F (36.9 ??C) 94 18 134/82 99 %   12/01/15 1930 99.6 ??F (37.6 ??C) (!) 111 20 131/80 98 %       Pain Assessment  Pain Intensity 1: 4 (12/01/15 2230)  Pain Location 1: Buttocks  Pain Intervention(s) 1: Medication (see MAR)  Patient Stated Pain Goal: 0    Ambulating  YES  drsg remains intact to buttocks.    Shift report given to oncoming nurse at the bedside.    Leda Quail, RN

## 2015-12-03 ENCOUNTER — Encounter: Admit: 2015-12-03 | Discharge: 2015-12-03 | Payer: Self-pay

## 2015-12-04 LAB — CULTURE, WOUND W GRAM STAIN: GRAM STAIN: 5

## 2015-12-04 MED ORDER — AMOXICILLIN-CLAVULANATE 500 MG-125 MG TAB
500-125 mg | ORAL_TABLET | Freq: Three times a day (TID) | ORAL | 0 refills | Status: DC
Start: 2015-12-04 — End: 2016-01-18

## 2015-12-04 NOTE — Progress Notes (Signed)
Per Dr Marrian Salvage antibiotic was changed due to a rash and itching. She will follow up as appointed.  Nani Gasser

## 2015-12-05 ENCOUNTER — Encounter: Admit: 2015-12-05 | Discharge: 2015-12-05 | Payer: Self-pay

## 2015-12-05 LAB — CULTURE, BLOOD
Culture result:: NO GROWTH
Culture result:: NO GROWTH

## 2015-12-09 LAB — CULTURE, ANAEROBIC

## 2015-12-10 ENCOUNTER — Encounter

## 2015-12-10 ENCOUNTER — Encounter: Admit: 2015-12-10 | Discharge: 2015-12-10 | Payer: Self-pay

## 2015-12-11 ENCOUNTER — Encounter

## 2015-12-13 ENCOUNTER — Encounter

## 2015-12-16 ENCOUNTER — Ambulatory Visit: Admit: 2015-12-16 | Discharge: 2015-12-16 | Attending: Surgery

## 2015-12-16 DIAGNOSIS — M7989 Other specified soft tissue disorders: Secondary | ICD-10-CM

## 2015-12-16 NOTE — Progress Notes (Signed)
[facility]  [address], [address]  [place] [address]  [phone]    Office Note/H&P/Consult Note   Patricia Mcdonald   MRN: [id number]     DOB: 1996/10/11        HPI: Patricia Mcdonald is a 20 y.o. female who is s/p emergent I&D of a pelvirectal abscess on 12/01/15.  She had sepsis while hospitalized and her surgery had to be delayed because she ate pizza just before leaving home to come to the ER.  Blood cultures x 2 were negative.  She was treated with IV Vanc/Zosyn as inpt and then converted to PO (bactrim-->augmentin) as outpt.  She reported an allergy to Bactrim  Her OR cultures have grown MRSA after she was already outpt    She originally presented to the ER complaining of a 5 day history of severe, progressive, non-radiating peri-anal and buttock pain.    She is morbidly obese.  She had no history of perirectal abscess, DM, or Crohn's disease.    She was s/p prior labial abscess drainage yrs ago at another facility.       No past medical history on file.  No past surgical history on file.  Current Outpatient Prescriptions   Medication Sig   ??? amoxicillin-clavulanate (AUGMENTIN) 500-125 mg per tablet Take 1 Tab by mouth three (3) times daily.   ??? oxyCODONE-acetaminophen (PERCOCET) 5-325 mg per tablet Take 2 Tabs by mouth every four (4) hours as needed for Pain.     No current facility-administered medications for this visit.      ALLERGIES:  Sulfa (sulfonamide antibiotics)    Social History     Social History   ??? Marital status: SINGLE     Spouse name: N/A   ??? Number of children: N/A   ??? Years of education: N/A     Social History Main Topics   ??? Smoking status: Never Smoker   ??? Smokeless tobacco: None   ??? Alcohol use None   ??? Drug use: None   ??? Sexual activity: Not Asked     Other Topics Concern   ??? None     Social History Narrative     History   Smoking Status   ??? Never Smoker   Smokeless Tobacco   ??? Not on file     No family history on file.     ROS: The patient has no difficulty with chest pain or shortness of breath.  No fever or chills.  Comprehensive review of systems was otherwise unremarkable except as noted above.    Physical Exam:     There were no vitals taken for this visit.  (from 12/01/15)  Constitutional: Alert oriented cooperative patient in no acute distress.   Eyes:Sclera are clear.   ENMT: no obvious neck masses, no ear or lip lesions  CV: RRR. Normal perfusion  Resp: No JVD.  Breathing is  non-labored.    GI: soft and non-distended ; morbid obesity  Buttock wound granulating; no cellulitis; no more depth    Musculoskeletal: unremarkable with normal function.   Neuro:  No obvious focal deficits  Psychiatric: normal affect and mood, no memory impairment      Labs:  Lab Results   Component Value Date/Time    WBC 16.3 11/30/2015 07:05 PM    PLATELET 276 11/30/2015 07:05 PM    Sodium 139 11/30/2015 07:05 PM    Potassium 4.3 11/30/2015 07:05 PM    Chloride 105 11/30/2015 07:05  PM    CO2 27 11/30/2015 07:05 PM    BUN 5 11/30/2015 07:05 PM    Creatinine 0.79 11/30/2015 07:05 PM    Glucose 99 11/30/2015 07:05 PM    Bilirubin, total 0.6 11/30/2015 07:05 PM    AST (SGOT) 19 11/30/2015 07:05 PM    ALT (SGPT) 20 11/30/2015 07:05 PM    Alk. phosphatase 104 11/30/2015 07:05 PM       No results found for this or any previous visit.      Assessment/Plan:     )Patricia Mcdonald is a 20 y.o. female who has signs and symptoms consistent with the below issues:  Problem List  Date Reviewed: 2016/01/04          Codes Class Noted    Pelvirectal abscess ([place]) ICD-10-CM: K65.8  ICD-9-CM: 567.22  11/30/2015        Abscess of buttock ICD-10-CM: L02.31  ICD-9-CM: 682.5  11/30/2015        Morbid obesity due to excess calories ([place]) ICD-10-CM: E66.01  ICD-9-CM: 278.01  11/30/2015        Necrotizing soft tissue infection ([place]) ICD-10-CM: M72.6  ICD-9-CM: 728.86  11/30/2015            Continue home health wound care  Weight loss for obesity  She has finished her outpt PO abx.   Follow-up 2 weeks      Eugene Garnet, MD,  FACS

## 2015-12-16 NOTE — Patient Instructions (Signed)
[facility]  [address], [address]  [place] [address]  [phone]    CARE INSTRUCTIONS  Your surgeon has given you verbal instructions regarding your plan of care today.    FOLLOW-UP AFTER A TEST OR STUDY  If you are having labs drawn or a radiologic study, there will be a follow-up appt needed after that, which either has already been made for you today or will need to be made by you to review the results of your study or test (unless special arrangements were made today for you by your surgeon).      We are unable to review results over the phone because those results invariably lead to questions that you and your surgeon need to make together during an appt.  Therefore, please do not call the office for test results as they should be discussed at your next appt, and if you don't have a next appt, please call and get one after your test is completed, or as you were instructed.    AFTER-HOURS CALLS  We have a surgeon-call for emergencies 24 hrs a day.  After the office is closed, which is usually at 5PM and on weekends and holidays, the on-call surgeon can be reached by dialing our office number ([phone]).    Please do not call unless you are having an urgent or emergent issue.  The on-call surgeon cannot make appt changes or call in refills for pain medications after the office is closed.

## 2015-12-17 ENCOUNTER — Encounter: Admit: 2015-12-17 | Discharge: 2015-12-17 | Payer: Self-pay

## 2015-12-18 ENCOUNTER — Encounter

## 2015-12-19 ENCOUNTER — Encounter

## 2015-12-20 NOTE — Progress Notes (Signed)
Called ultram  # 24 1 po q4-6hrs prn pain to her pharmacy. Nani Gasser

## 2016-01-06 ENCOUNTER — Ambulatory Visit: Payer: PRIVATE HEALTH INSURANCE | Attending: Surgery

## 2016-01-06 NOTE — Progress Notes (Signed)
No show

## 2016-01-09 ENCOUNTER — Inpatient Hospital Stay
Admit: 2016-01-09 | Discharge: 2016-01-09 | Disposition: A | Discharge status: Home / Self Care | Payer: PRIVATE HEALTH INSURANCE | Attending: Emergency Medicine

## 2016-01-09 DIAGNOSIS — N751 Abscess of Bartholin's gland: Secondary | ICD-10-CM

## 2016-01-09 MED ORDER — DOXYCYCLINE HYCLATE 100 MG TAB
100 mg | ORAL_TABLET | Freq: Two times a day (BID) | ORAL | 0 refills | Status: AC
Start: 2016-01-09 — End: 2016-01-19

## 2016-01-09 MED ORDER — TRAMADOL 50 MG TAB
50 mg | ORAL_TABLET | Freq: Four times a day (QID) | ORAL | 0 refills | Status: DC | PRN
Start: 2016-01-09 — End: 2016-01-14

## 2016-01-09 NOTE — ED Notes (Signed)
I have reviewed discharge instructions with the patient.  The patient verbalized understanding.

## 2016-01-09 NOTE — ED Triage Notes (Signed)
Pt arrives POV from home c/o abscess to L inner upper leg x 1 week. States has had similar in past.

## 2016-01-09 NOTE — ED Provider Notes (Signed)
HPI Comments: Patient is here with a one-week history of a Bartholin gland abscess on the left perineal area that started approximately a week ago.  She states it is now hurting to walk because her legs rub.  She's had no fever, nausea, vomiting, chest pain, shortness of breath, abdominal pain, trouble with urination or bowel movements, swelling of her arms or legs or other symptoms.  She is ambulatory to the room without difficulty and well-hydrated.    Patient is a 20 y.o. female presenting with abscess. The history is provided by the patient.   Abscess           History reviewed. No pertinent past medical history.    History reviewed. No pertinent surgical history.      History reviewed. No pertinent family history.    Social History     Social History   ??? Marital status: SINGLE     Spouse name: N/A   ??? Number of children: N/A   ??? Years of education: N/A     Occupational History   ??? Not on file.     Social History Main Topics   ??? Smoking status: Never Smoker   ??? Smokeless tobacco: Not on file   ??? Alcohol use Not on file   ??? Drug use: Not on file   ??? Sexual activity: Not on file     Other Topics Concern   ??? Not on file     Social History Narrative         ALLERGIES: Sulfa (sulfonamide antibiotics)    Review of Systems   Constitutional: Negative.    HENT: Negative.    Eyes: Negative.    Respiratory: Negative.    Cardiovascular: Negative.    Gastrointestinal: Negative.    Genitourinary: Negative.    Musculoskeletal: Negative.    Skin: Positive for wound.   Neurological: Negative.    Psychiatric/Behavioral: Negative.    All other systems reviewed and are negative.      Vitals:    01/09/16 1118 01/09/16 1333   BP: 139/82 118/80   Pulse: 98 96   Resp: 18 16   Temp: 97.8 ??F (36.6 ??C) 97.8 ??F (36.6 ??C)   SpO2: 99% 99%   Weight: 99.8 kg (220 lb)    Height:  (1.6 m)             Physical Exam   Constitutional: She is oriented to person, place, and time. She appears well-developed and well-nourished.   HENT:    Head: Normocephalic and atraumatic.   Right Ear: External ear normal.   Left Ear: External ear normal.   Nose: Nose normal.   Mouth/Throat: Oropharynx is clear and moist.   Eyes: Conjunctivae and EOM are normal. Pupils are equal, round, and reactive to light.   Neck: Normal range of motion. Neck supple.   Cardiovascular: Normal rate, regular rhythm, normal heart sounds and intact distal pulses.    Pulmonary/Chest: Effort normal and breath sounds normal.   Abdominal: Soft. Bowel sounds are normal.   Genitourinary:         Musculoskeletal: Normal range of motion.   Neurological: She is alert and oriented to person, place, and time. She has normal reflexes.   Skin: Skin is warm and dry.   Psychiatric: She has a normal mood and affect. Her behavior is normal. Judgment and thought content normal.   Nursing note and vitals reviewed.       MDM  Number of Diagnoses or Management  Options  Bartholin's gland abscess: new and requires workup  Risk of Complications, Morbidity, and/or Mortality  Presenting problems: moderate  Diagnostic procedures: moderate  Management options: moderate    Patient Progress  Patient progress: improved    ED Course       I&D Abcess Simple  Date/Time: 01/09/2016 2:30 PM  Performed by: Joanie CoddingtonMOREHOUSE-MOORE, Eston Heslin L  Authorized by: Joanie CoddingtonMOREHOUSE-MOORE, Kierra Jezewski L     Consent:     Consent obtained:  Verbal    Consent given by:  Patient    Risks discussed:  Bleeding, incomplete drainage, pain, damage to other organs and infection    Alternatives discussed:  No treatment  Location:     Type:  Bartholin cyst    Size:  3 cm    Location:  Anogenital    Anogenital location:  Bartholin's gland  Pre-procedure details:     Skin preparation:  Betadine  Anesthesia (see MAR for exact dosages):     Anesthesia method:  Local infiltration    Local anesthetic:  Lidocaine 1% w/o epi  Procedure type:     Complexity:  Simple  Procedure details:     Needle aspiration: no      Incision types:  Single straight     Incision depth:  Subcutaneous    Scalpel blade:  11    Wound management:  Probed and deloculated, irrigated with saline and extensive cleaning    Drainage:  Purulent and bloody    Drainage amount:  Copious    Wound treatment:  Wound left open    Packing materials:  Word catheter  Post-procedure details:     Patient tolerance of procedure:  Tolerated well, no immediate complications  Comments:      Patient is feeling much better after procedure.          The patient was observed in the ED.  Patient was referred to gynecology for further evaluation of the Bartholin gland cyst.  She should return here in 2-3 days or with them for the Word catheter removal.  The area is still draining well with pus.  She was written antibiotics to start taking and was told to wash the area twice daily with soap and water, blot dry and return to the ED if worsening in any way.  I discussed the results of all labs, procedures, radiographs, and treatments with the patient and available family.  Treatment plan is agreed upon and the patient is ready for discharge.  All voiced understanding of the discharge plan and medication instructions or changes as appropriate.  Questions about treatment in the ED were answered.  All were encouraged to return should symptoms worsen or new problems develop.

## 2016-01-14 ENCOUNTER — Inpatient Hospital Stay
Admit: 2016-01-14 | Discharge: 2016-01-14 | Disposition: A | Discharge status: Home / Self Care | Payer: PRIVATE HEALTH INSURANCE | Attending: Emergency Medicine

## 2016-01-14 DIAGNOSIS — L0231 Cutaneous abscess of buttock: Secondary | ICD-10-CM

## 2016-01-14 MED ORDER — TRAMADOL 50 MG TAB
50 mg | ORAL_TABLET | Freq: Four times a day (QID) | ORAL | 0 refills | Status: AC | PRN
Start: 2016-01-14 — End: ?

## 2016-01-14 NOTE — ED Notes (Signed)
Assisted MD with exam.

## 2016-01-14 NOTE — ED Triage Notes (Signed)
Reports here for drain removal after I & D recently and also has an abscess on her behind.

## 2016-01-14 NOTE — ED Provider Notes (Signed)
HPI Comments:     Returns for recheck of abscess.  Word catheter placed.  This is been in 3 days.  As another small abscess on her left buttock that  she would like checked    Patient is a 20 y.o. female presenting with abscess. The history is provided by the patient.   Abscess    This is a recurrent problem. The problem has been rapidly improving. The problem is associated with nothing. There has been no fever. Affected Location: left medial thigh. The pain is at a severity of 4/10. The pain is moderate. Associated symptoms include itching. The treatment provided moderate relief.        History reviewed. No pertinent past medical history.    History reviewed. No pertinent surgical history.      History reviewed. No pertinent family history.    Social History     Social History   ??? Marital status: SINGLE     Spouse name: N/A   ??? Number of children: N/A   ??? Years of education: N/A     Occupational History   ??? Not on file.     Social History Main Topics   ??? Smoking status: Never Smoker   ??? Smokeless tobacco: Not on file   ??? Alcohol use Not on file   ??? Drug use: Not on file   ??? Sexual activity: Not on file     Other Topics Concern   ??? Not on file     Social History Narrative         ALLERGIES: Sulfa (sulfonamide antibiotics)    Review of Systems   Constitutional: Negative for chills and fever.   HENT: Negative.    Respiratory: Negative.    Cardiovascular: Negative.    Genitourinary: Negative.    Skin: Positive for itching and wound.       Vitals:    01/14/16 1451   BP: (!) 142/97   Pulse: (!) 118   Resp: 18   Temp: 99.2 ??F (37.3 ??C)   SpO2: 97%   Weight: 99.8 kg (220 lb)   Height: 5\' 3"  (1.6 m)            Physical Exam   Constitutional: She appears well-developed and well-nourished.   HENT:   Head: Normocephalic and atraumatic.   Eyes: Pupils are equal, round, and reactive to light.   Neck: Normal range of motion. Neck supple.   Pulmonary/Chest: Effort normal and breath sounds normal.    Abdominal: Soft. There is no tenderness.   Skin:   Wound clean and dry and intact with Word catheter.  Small subcutaneous abscess with some drainage to the left buttocks   Nursing note and vitals reviewed.       MDM  Number of Diagnoses or Management Options  Abscess:      Amount and/or Complexity of Data Reviewed  Review and summarize past medical records: yes (Reviewed previous visit)    Risk of Complications, Morbidity, and/or Mortality  General comments:   Abscess to her left pelvic area looks good.  Word catheter intact. Abscess on her left buttocks is draining, has very little induration, does not require I&D    Patient Progress  Patient progress: stable    ED Course       Procedures

## 2016-01-14 NOTE — ED Notes (Signed)
I have reviewed discharge instructions with the patient.  The patient verbalized understanding. Patient appears in no acute distress upon discharge.

## 2016-01-15 ENCOUNTER — Ambulatory Visit: Admit: 2016-01-15 | Discharge: 2016-01-15 | Payer: PRIVATE HEALTH INSURANCE | Attending: Surgery

## 2016-01-15 DIAGNOSIS — M7989 Other specified soft tissue disorders: Secondary | ICD-10-CM

## 2016-01-15 NOTE — Patient Instructions (Signed)
[facility]  [address], [address]  [place]   [address]  [phone]      Care Instructions  Your surgeon has given you verbal instructions regarding your plan of care today.    FOLLOW -UP AFTER A TEST OR STUDY  If you are having labs drawn or a radiologic study, there will be a follow-up appointment needed after that, which either has already been made for you today or it will need to be made by you to review the results of your study or test (unless special arrangements were made today for you by your surgeon).    We are unable to review results over the phone because those results invariably lead to questions that you and your surgeon need to make together during an appointment.  Therefore, please do not call the office for test results as they should be discussed at your next appointment, and if you don't have an appointment, please call and scheduled one after your test is completed, or as you were instructed.    AFTER-HOURS CALLS  We have a surgeon on call for emergencies 24 hours a day.  After the office is closed, which is 5PM on Monday- Thursday and 3PM on Fridays and when the  office is closed on weekends and holidays - the on-call surgeon can be reached by dialing our office number ([phone]).    Please do not call unless you are having an urgent or emergent issue.  The on-call surgeon cannot make appointment changes or call in refills for pain medications after the office is closed.

## 2016-01-15 NOTE — Addendum Note (Signed)
Addended by: Rhett BannisterWLER, Stefen Juba A on: 01/15/2016 11:34 AM      Modules accepted: Orders

## 2016-01-15 NOTE — Progress Notes (Addendum)
[facility]  [address], [address]  [place] [address]  [phone]    Office Note/H&P/Consult Note   Patricia Mcdonald   MRN: [id number]     DOB: 04-17-1996        HPI: Patricia Mcdonald is a 20 y.o. female who is s/p emergent I&D of a pelvirectal abscess on 12/01/15.  She had sepsis and her blood cultures were negative x2.  She was treated with IV Vanc/Zosyn as inpt and then converted to PO (bactrim-->augmentin) as outpt.  She reported an allergy to Bactrim  Her OR cultures grew MRSA.    She is morbidly obese.  She had no history of perirectal abscess, DM, or Crohn's disease.    She was s/p prior labial abscess drainage yrs ago at another facility.     She missed follow-up appointments with me but returns today and her pelvirectal abscess I&D site has completely healed.  She is asymptomatic from this.    In the interim, she went to the emergency room on 01/14/16 with an infected Bartholin's gland abscess for which the emergency room placed a drainage catheter and referred her to gynecology.  She returned to the emergency room yesterday for a check on the tube.      History reviewed. No pertinent past medical history.  No past surgical history on file.  Current Outpatient Prescriptions   Medication Sig   ??? traMADol (ULTRAM) 50 mg tablet Take 1 Tab by mouth every six (6) hours as needed for Pain. Max Daily Amount: 200 mg.   ??? doxycycline (VIBRA-TABS) 100 mg tablet Take 1 Tab by mouth two (2) times a day for 10 days.   ??? amoxicillin-clavulanate (AUGMENTIN) 500-125 mg per tablet Take 1 Tab by mouth three (3) times daily.   ??? oxyCODONE-acetaminophen (PERCOCET) 5-325 mg per tablet Take 2 Tabs by mouth every four (4) hours as needed for Pain.     No current facility-administered medications for this visit.      ALLERGIES:  Sulfa (sulfonamide antibiotics)    Social History     Social History   ??? Marital status: SINGLE     Spouse name: N/A   ??? Number of children: N/A    ??? Years of education: N/A     Social History Main Topics   ??? Smoking status: Never Smoker   ??? Smokeless tobacco: None   ??? Alcohol use None   ??? Drug use: None   ??? Sexual activity: Not Asked     Other Topics Concern   ??? None     Social History Narrative     History   Smoking Status   ??? Never Smoker   Smokeless Tobacco   ??? Not on file     No family history on file.    ROS: The patient has no difficulty with chest pain or shortness of breath.  No fever or chills.  Comprehensive review of systems was otherwise unremarkable except as noted above.    Physical Exam:     Visit Vitals   ??? BP 122/74   ??? Pulse (!) 114   ??? Ht 5' 3"  (1.6 m)   ??? Wt 249 lb 14.4 oz (113.4 kg)   ??? LMP 12/25/2015 (Approximate)   ??? SpO2 99%   ??? BMI 44.27 kg/m2     (from 12/01/15)  Constitutional: Alert oriented cooperative patient in no acute distress.   Eyes:Sclera are clear.   ENMT: no obvious neck masses, no  ear or lip lesions  CV: RRR. Normal perfusion  Resp: No JVD.  Breathing is  non-labored.    GI: soft and non-distended ; morbid obesity  Buttock wound healed but indurated on left; no cellulitis   She has a drainage catheter exiting just lateral to her left labia.  There is no cellulitis superficially.  Musculoskeletal: unremarkable with normal function.   Neuro:  No obvious focal deficits  Psychiatric: normal affect and mood, no memory impairment      Labs:  Lab Results   Component Value Date/Time    WBC 16.3 11/30/2015 07:05 PM    PLATELET 276 11/30/2015 07:05 PM    Sodium 139 11/30/2015 07:05 PM    Potassium 4.3 11/30/2015 07:05 PM    Chloride 105 11/30/2015 07:05 PM    CO2 27 11/30/2015 07:05 PM    BUN 5 11/30/2015 07:05 PM    Creatinine 0.79 11/30/2015 07:05 PM    Glucose 99 11/30/2015 07:05 PM    Bilirubin, total 0.6 11/30/2015 07:05 PM    AST (SGOT) 19 11/30/2015 07:05 PM    ALT (SGPT) 20 11/30/2015 07:05 PM    Alk. phosphatase 104 11/30/2015 07:05 PM       No results found for this or any previous visit.       Assessment/Plan:     )Patricia Mcdonald is a 20 y.o. female who has signs and symptoms consistent with the below issues:  Problem List  Date Reviewed: 2016/01/30          Codes Class Noted    Pelvirectal abscess ([place]) ICD-10-CM: K65.8  ICD-9-CM: 567.22  11/30/2015        Abscess of buttock ICD-10-CM: L02.31  ICD-9-CM: 682.5  11/30/2015        Morbid obesity due to excess calories ([place]) ICD-10-CM: E66.01  ICD-9-CM: 278.01  11/30/2015        Necrotizing soft tissue infection ([place]) ICD-10-CM: M72.6  ICD-9-CM: 728.86  11/30/2015            Her pelvirectal abscess has been completely treated and has resolved.    She now has active difficulties with an infected labial abscess   for which the ER placed a drainage catheter and referred her to gynecology.    She will follow-up with GYN for this.    We will have CT imaging performed of her pelvis to rule out any undrained collections and I will see her back in about 10 days to regroup.   Her prior infection in the perirectal region was with MRSA.  I wrote a prescription for doxycycline 100 mg twice daily.  She cannot afford Zyvox and is allergic to Bactrim.      Eugene Garnet, MD,  FACS

## 2016-01-18 ENCOUNTER — Inpatient Hospital Stay
Admit: 2016-01-18 | Discharge: 2016-01-19 | Disposition: A | Discharge status: Home / Self Care | Payer: Self-pay | Attending: Emergency Medicine

## 2016-01-18 ENCOUNTER — Emergency Department: Admit: 2016-01-18 | Payer: Self-pay

## 2016-01-18 DIAGNOSIS — N751 Abscess of Bartholin's gland: Secondary | ICD-10-CM

## 2016-01-18 LAB — CBC WITH AUTOMATED DIFF
ABS. BASOPHILS: 0 10*3/uL (ref 0.0–0.2)
ABS. EOSINOPHILS: 0.2 10*3/uL (ref 0.0–0.8)
ABS. IMM. GRANS.: 0.1 10*3/uL (ref 0.0–0.5)
ABS. LYMPHOCYTES: 1.5 10*3/uL (ref 0.5–4.6)
ABS. MONOCYTES: 1.4 10*3/uL — ABNORMAL HIGH (ref 0.1–1.3)
ABS. NEUTROPHILS: 15.3 10*3/uL — ABNORMAL HIGH (ref 1.7–8.2)
BASOPHILS: 0 % (ref 0.0–2.0)
EOSINOPHILS: 1 % (ref 0.5–7.8)
HCT: 36.9 % (ref 35.8–46.3)
HGB: 12.1 g/dL (ref 11.7–15.4)
IMMATURE GRANULOCYTES: 0.3 % (ref 0.0–5.0)
LYMPHOCYTES: 8 % — ABNORMAL LOW (ref 13–44)
MCH: 21.9 PG — ABNORMAL LOW (ref 26.1–32.9)
MCHC: 32.8 g/dL (ref 31.4–35.0)
MCV: 66.7 FL — ABNORMAL LOW (ref 79.6–97.8)
MONOCYTES: 8 % (ref 4.0–12.0)
MPV: 10.8 FL (ref 10.8–14.1)
NEUTROPHILS: 83 % — ABNORMAL HIGH (ref 43–78)
PLATELET: 344 10*3/uL (ref 150–450)
RBC: 5.53 M/uL — ABNORMAL HIGH (ref 4.05–5.25)
RDW: 16.1 % — ABNORMAL HIGH (ref 11.9–14.6)
WBC: 18.4 10*3/uL — ABNORMAL HIGH (ref 4.3–11.1)

## 2016-01-18 LAB — DRUG SCREEN, URINE
AMPHETAMINES: NEGATIVE
BARBITURATES: NEGATIVE
BENZODIAZEPINES: NEGATIVE
COCAINE: NEGATIVE
METHADONE: NEGATIVE
OPIATES: POSITIVE
PCP(PHENCYCLIDINE): NEGATIVE
THC (TH-CANNABINOL): POSITIVE

## 2016-01-18 LAB — URINE MICROSCOPIC

## 2016-01-18 LAB — POC LACTIC ACID: Lactic Acid (POC): 1.2 mmol/L (ref 0.5–1.9)

## 2016-01-18 LAB — METABOLIC PANEL, COMPREHENSIVE
A-G Ratio: 0.6 — ABNORMAL LOW (ref 1.2–3.5)
ALT (SGPT): 40 U/L (ref 12–65)
AST (SGOT): 36 U/L (ref 15–37)
Albumin: 3.3 g/dL — ABNORMAL LOW (ref 3.5–5.0)
Alk. phosphatase: 133 U/L (ref 50–136)
Anion gap: 10 mmol/L (ref 7–16)
BUN: 6 MG/DL (ref 6–23)
Bilirubin, total: 1 MG/DL (ref 0.2–1.1)
CO2: 24 mmol/L (ref 21–32)
Calcium: 8.9 MG/DL (ref 8.3–10.4)
Chloride: 102 mmol/L (ref 98–107)
Creatinine: 0.83 MG/DL (ref 0.6–1.0)
GFR est AA: >60 mL/min/{1.73_m2} (ref >60)
GFR est non-AA: >60 mL/min/{1.73_m2} (ref >60)
Globulin: 5.6 g/dL — ABNORMAL HIGH (ref 2.3–3.5)
Glucose: 83 mg/dL (ref 65–100)
Potassium: 3.4 mmol/L — ABNORMAL LOW (ref 3.5–5.1)
Protein, total: 8.9 g/dL — ABNORMAL HIGH (ref 6.3–8.2)
Sodium: 136 mmol/L (ref 136–145)

## 2016-01-18 LAB — PROCALCITONIN: Procalcitonin: 0.1 ng/mL

## 2016-01-18 LAB — HCG URINE, QL: HCG urine, QL: NEGATIVE

## 2016-01-18 MED ORDER — SODIUM CHLORIDE 0.9% BOLUS IV
0.9 % | Freq: Once | INTRAVENOUS | Status: AC
Start: 2016-01-18 — End: 2016-01-18
  Administered 2016-01-18: 20:00:00 via INTRAVENOUS

## 2016-01-18 MED ORDER — SODIUM CHLORIDE 0.9% BOLUS IV
0.9 % | Freq: Once | INTRAVENOUS | Status: AC
Start: 2016-01-18 — End: 2016-01-18
  Administered 2016-01-18: 17:00:00 via INTRAVENOUS

## 2016-01-18 MED ORDER — VANCOMYCIN IN 0.9% SODIUM CHLORIDE 1.5 G/500 ML IV
1.5 g/500 mL | Freq: Once | INTRAVENOUS | Status: AC
Start: 2016-01-18 — End: 2016-01-18
  Administered 2016-01-18: 18:00:00 via INTRAVENOUS

## 2016-01-18 MED ORDER — IOPAMIDOL 76 % IV SOLN
370 mg iodine /mL (76 %) | Freq: Once | INTRAVENOUS | Status: AC
Start: 2016-01-18 — End: 2016-01-18
  Administered 2016-01-18: 20:00:00 via INTRAVENOUS

## 2016-01-18 MED ORDER — SODIUM CHLORIDE 0.9 % IV
10 gram | Freq: Once | INTRAVENOUS | Status: DC
Start: 2016-01-18 — End: 2016-01-18

## 2016-01-18 MED ORDER — ADV ADDAPTOR
1 gram | Status: DC
Start: 2016-01-18 — End: 2016-01-18

## 2016-01-18 MED ORDER — SALINE PERIPHERAL FLUSH PRN
Freq: Once | INTRAMUSCULAR | Status: AC
Start: 2016-01-18 — End: 2016-01-18
  Administered 2016-01-18: 20:00:00

## 2016-01-18 MED ORDER — SODIUM CHLORIDE 0.9% BOLUS IV
0.9 % | Freq: Once | INTRAVENOUS | Status: AC
Start: 2016-01-18 — End: 2016-01-18
  Administered 2016-01-18: 19:00:00 via INTRAVENOUS

## 2016-01-18 MED FILL — VANCOMYCIN IN 0.9% SODIUM CHLORIDE 1.5 G/500 ML IV: 1.5 g/500 mL | INTRAVENOUS | Qty: 500

## 2016-01-18 NOTE — ED Provider Notes (Signed)
HPI Comments: Patient had a WORD cath placed 8 days ago for bartholin's cyst. She states she was told to leave catheter in place for 3 days. She states she return to cath drain removed and was told catheter needs to be in place for at least a week. She states she is here today due to drain being uncomfortable. She states she has a follow up appointment with GYN but appointment is not until the end of next week.     Patient is a 20 y.o. female presenting with wound check. The history is provided by the patient.   Wound Check    This is a recurrent problem. The current episode started more than 1 week ago. The problem occurs constantly. The problem has not changed since onset.The pain is present in the left upper leg. The quality of the pain is described as dull. The pain is at a severity of 8/10. The pain is mild. Pertinent negatives include no numbness, full range of motion, no stiffness, no tingling, no itching, no back pain and no neck pain.        History reviewed. No pertinent past medical history.    History reviewed. No pertinent surgical history.      History reviewed. No pertinent family history.    Social History     Social History   ??? Marital status: SINGLE     Spouse name: N/A   ??? Number of children: N/A   ??? Years of education: N/A     Occupational History   ??? Not on file.     Social History Main Topics   ??? Smoking status: Never Smoker   ??? Smokeless tobacco: Not on file   ??? Alcohol use Not on file   ??? Drug use: Not on file   ??? Sexual activity: Not on file     Other Topics Concern   ??? Not on file     Social History Narrative         ALLERGIES: Sulfa (sulfonamide antibiotics)    Review of Systems   Constitutional: Negative for chills and fever.   Genitourinary: Positive for vaginal pain. Negative for difficulty urinating.   Musculoskeletal: Negative for back pain, neck pain and stiffness.   Skin: Positive for wound. Negative for color change, itching, pallor and rash.    Neurological: Negative for dizziness, tingling, weakness and numbness.       Visit Vitals   ??? BP (!) 124/91 (BP 1 Location: Right arm, BP Patient Position: At rest)   ??? Pulse (!) 115   ??? Temp 99.4 ??F (37.4 ??C)   ??? Resp 22   ??? Ht 5' 3"  (1.6 m)   ??? Wt 99.8 kg (220 lb)   ??? SpO2 100%   ??? BMI 38.97 kg/m2            Recent Results (from the past 12 hour(s))   CBC WITH AUTOMATED DIFF    Collection Time: 01/18/16  1:10 PM   Result Value Ref Range    WBC 18.4 (H) 4.3 - 11.1 K/uL    RBC 5.53 (H) 4.05 - 5.25 M/uL    HGB 12.1 11.7 - 15.4 g/dL    HCT 36.9 35.8 - 46.3 %    MCV 66.7 (L) 79.6 - 97.8 FL    MCH 21.9 (L) 26.1 - 32.9 PG    MCHC 32.8 31.4 - 35.0 g/dL    RDW 16.1 (H) 11.9 - 14.6 %    PLATELET 344 150 -  450 K/uL    MPV 10.8 10.8 - 14.1 FL    DF AUTOMATED      NEUTROPHILS 83 (H) 43 - 78 %    LYMPHOCYTES 8 (L) 13 - 44 %    MONOCYTES 8 4.0 - 12.0 %    EOSINOPHILS 1 0.5 - 7.8 %    BASOPHILS 0 0.0 - 2.0 %    IMMATURE GRANULOCYTES 0.3 0.0 - 5.0 %    ABS. NEUTROPHILS 15.3 (H) 1.7 - 8.2 K/UL    ABS. LYMPHOCYTES 1.5 0.5 - 4.6 K/UL    ABS. MONOCYTES 1.4 (H) 0.1 - 1.3 K/UL    ABS. EOSINOPHILS 0.2 0.0 - 0.8 K/UL    ABS. BASOPHILS 0.0 0.0 - 0.2 K/UL    ABS. IMM. GRANS. 0.1 0.0 - 0.5 K/UL   METABOLIC PANEL, COMPREHENSIVE    Collection Time: 01/18/16  1:10 PM   Result Value Ref Range    Sodium 136 136 - 145 mmol/L    Potassium 3.4 (L) 3.5 - 5.1 mmol/L    Chloride 102 98 - 107 mmol/L    CO2 24 21 - 32 mmol/L    Anion gap 10 7 - 16 mmol/L    Glucose 83 65 - 100 mg/dL    BUN 6 6 - 23 MG/DL    Creatinine 0.83 0.6 - 1.0 MG/DL    GFR est AA >60 >60 ml/min/1.32m    GFR est non-AA >60 >60 ml/min/1.742m   Calcium 8.9 8.3 - 10.4 MG/DL    Bilirubin, total 1.0 0.2 - 1.1 MG/DL    ALT (SGPT) 40 12 - 65 U/L    AST (SGOT) 36 15 - 37 U/L    Alk. phosphatase 133 50 - 136 U/L    Protein, total 8.9 (H) 6.3 - 8.2 g/dL    Albumin 3.3 (L) 3.5 - 5.0 g/dL    Globulin 5.6 (H) 2.3 - 3.5 g/dL    A-G Ratio 0.6 (L) 1.2 - 3.5     PROCALCITONIN     Collection Time: 01/18/16  1:10 PM   Result Value Ref Range    Procalcitonin 0.1 ng/mL   POC LACTIC ACID    Collection Time: 01/18/16  1:14 PM   Result Value Ref Range    Lactic Acid (POC) 1.2 0.5 - 1.9 mmol/L   DRUG SCREEN, URINE    Collection Time: 01/18/16  1:16 PM   Result Value Ref Range    PCP(PHENCYCLIDINE) NEGATIVE       BENZODIAZEPINE NEGATIVE       COCAINE NEGATIVE       AMPHETAMINE NEGATIVE       METHADONE NEGATIVE       THC (TH-CANNABINOL) POSITIVE      OPIATES POSITIVE      BARBITURATES NEGATIVE      URINE MICROSCOPIC    Collection Time: 01/18/16  1:16 PM   Result Value Ref Range    WBC 10-20 0 /hpf    RBC 0-3 0 /hpf    Epithelial cells 10-20 0 /hpf    Bacteria 1+ (H) 0 /hpf    Casts 50-100 0 /lpf    Mucus 2+ (H) 0 /lpf    Other observations RESULTS VERIFIED MANUALLY     EKG, 12 LEAD, INITIAL    Collection Time: 01/18/16  1:39 PM   Result Value Ref Range    Ventricular Rate 130 BPM    Atrial Rate 130 BPM    P-R Interval 164 ms  QRS Duration 68 ms    Q-T Interval 294 ms    QTC Calculation (Bezet) 432 ms    Calculated P Axis 70 degrees    Calculated R Axis 60 degrees    Calculated T Axis 32 degrees    Diagnosis       Sinus tachycardia  Otherwise normal ECG  No previous ECGs available         Physical Exam   Constitutional: She is oriented to person, place, and time. She appears well-developed and well-nourished. No distress.   HENT:   Head: Normocephalic and atraumatic.   Cardiovascular: Regular rhythm, normal heart sounds and intact distal pulses.  Tachycardia present.    Pulmonary/Chest: Effort normal and breath sounds normal.   Abdominal: Soft. Bowel sounds are normal. She exhibits no distension. There is no tenderness.   Musculoskeletal: Normal range of motion.   Neurological: She is alert and oriented to person, place, and time.   Skin: Skin is warm and dry. No ecchymosis noted. She is not diaphoretic. No erythema.        Psychiatric: She has a normal mood and affect.    Nursing note and vitals reviewed.       MDM  Number of Diagnoses or Management Options  Wound check, abscess: established and worsening  Diagnosis management comments: WORD cath removed and after assessment of patient HR further evaluation needed due to tachycardia. Patient noted to have elevated WBC, lactic, and ct scan noted possible tubo-ovarian abscess and possible deep tissue infection. Dr. Pascal Lux aware and has decussion with  general surgery and GYN . Dr. Deno Etienne and Dr. Janett Billow to evaluate patient.     Dr. Esmond Harps has evaluated patient and does not feel there anything surgical at this time. Patient is waiting for GYN evaluation. Dr. rimmer made aware of patient and her needs. He will assume care of patient at this time due it being the end of my shift.        Amount and/or Complexity of Data Reviewed  Clinical lab tests: ordered and reviewed  Tests in the radiology section of CPT??: ordered and reviewed  Tests in the medicine section of CPT??: reviewed and ordered  Review and summarize past medical records: yes  Discuss the patient with other providers: yes    Patient Progress  Patient progress: stable    ED Course       Procedures

## 2016-01-18 NOTE — ED Notes (Signed)
Patient awaiting evaluation by both General surgery as well as GYN in regards to her to primary findings of the ER involving the perirectal area as well as the abnormal ultrasound/CT.    Dr. Celso AmySadowski from general surgery evaluated the bedside and did not feel there was any immediate surgical problems that needed treatment or admission at this time and recommended continue antibiotic therapy and short-term follow-up.  The GYN specialist also arrived to the ER and is currently evaluating patient at bedside but feels that it is most likely a large fibroid on ultrasound and that this too can be followed up as an outpatient.  Awaiting final evaluation by the GYN prior to disposition but at this point appears likely would be recommended to be dispositioned as an outpatient to follow-up next week and to return to the ER in the interval for any acute changes.

## 2016-01-18 NOTE — ED Notes (Signed)
Report given to Kelley, RN.

## 2016-01-18 NOTE — ED Triage Notes (Signed)
Pt states she had a drain placed in her upper left leg because of an abscess. Pt went to her doctor and was told she needed to come to the er and have it looked at. Pt states the drain has been there for the past 8 days.

## 2016-01-18 NOTE — Progress Notes (Signed)
Kasandra KnudsenBrian M. Kevis Qu, M.D.   Colon and Rectal Surgeon  [facility]  [address], [address]  [place], [place] [id number]  Phone: [phone] Fax: [phone]      ED Evaluation Note  01/18/2016    Patricia Mcdonald  MRN: [id number]    Primary Care Physician: None    Reason for Consult: perirectal cellulitis    HISTORY OF PRESENT ILLNESS:   Patricia Mcdonald is a 20 y.o. year old female who I was asked to see regarding an abnormal CT scan of the pelvis.  The patient presented to the hospital ED 11/2014 with right buttock abscess and was taken by Dr Marrian Salvageowler for operative I&D.  The wound healed in and she recovered well.      She presented to the hospital ED 01/09/16 and was diagnosed with an infected Bartholin's gland cyst.  She had drainage performed and a small catheter placed.  She reported some left buttock pain to them at that time, but was reassured.  She returned to the ED 01/14/16 for wound check and again reported left buttock problems.  She was again reassured.  She then saw Dr Marrian Salvageowler 3 days ago as a final check on her right buttock wound.  That wound was felt to have healed, but he ordered a CT of her pelvis because of the persistent left sided pain/induration.      She came to the ED today to have the Word catheter removed from the Bartholin's I&D area.  Because of induration in the left buttock, CT was ordered.  This showed a pelvis collection associated with the left adnexa and inflammation in the left buttocks.  She denies abdominal pain.  She denies fevers.  She denies nausea/vomiting.  She is moving her bowels normally.  Her main complaint is that it is hard to sit because of the left buttock pain/inflammation.  She received a dose of vanc in the ED.    REVIEW OF SYSTEMS:   Constitutional: No fevers/chills, no weakness, no fatigue, no lightheadedness, no night sweats, no insomnia, no appetite changes, no weight changes, no memory problems.    Eyes: No changes in vision, no diplopia, no tearing, no scotomata, no pain   Ears/Nose/Throat/Mouth:  No change in hearing, no tinnitus, no bleeding, no epistaxis, no nasal discharge, no sinusitis.   Respiratory: No cough, no dyspnea, no wheezing, no hemoptysis, no sleep apnea   Cardiovascular: No chest pains, no chest pressure, no chest tightness, no palpitations   Gastrointestinal: No abdominal pains, no bowel habit changes, no nausea, no emesis, no hematochezia, no melena, no cramping, no constipation, no diarrhea, no incontinence, no jaundice, no diverticulosis. Otherwise per HPI   Genitourinary: No dysuria, no hematuria, no urinary frequency, no nocturia, no recent UTIs   Musculoskeletal: No back/neck pain, no arthritis, no joint pain/swelling, no muscle pains   Skin: No rashes, no itching, no ulcers   Hematologic:  No easy bruising, no anemia   Neurologic: No headaches, no dizziness, no seizures   Psychiatric: No depressive symptoms, no anxiety symptoms, no changes in mood, no substance abuse     PAST MEDICAL HISTORY:  none    PAST SURGICAL HISTORY:  I&D right ischiorectal abscess; I&D left bartholin's gland cyst    ALLERGIES: Sulfa    HOME MEDICATIONS:  Doxycycline given to her from Dr Marrian Salvageowler    SOCIAL HISTORY: Single, [place] in [place].  No children.  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  Nonsmoker.  No alcohol     PREVENTION: Never had a colonoscopy     FAMILY HISTORY: No colon or rectal cancer.  No history of polyps.  No breast, prostate, ovary, endometrial, gastric, or pancreatic cancers.    PHYSICAL EXAM:  Blood pressure (!) 124/91, pulse (!) 115, temperature 99.4 ??F (37.4 ??C), resp. rate 22, height  (1.6 m), weight 220 lb (99.8 kg), last menstrual period 12/25/2015, SpO2 100 %.  Body mass index is 38.97 kg/(m^2).  The patient was evaluated in the presence of a nurse and PA  General: Normotensive, in no acute distress, well developed, well  nourished appearing   Head:  AT/NC, no lesions   Eyes:  PERRLA, EOM's full, conjunctivae clear   Neck:  Supple, no masses, no lymphadenopathy, no thyromegaly, no carotid bruits   Chest:  Lungs clear, no rales, no rhonchi, no wheezes   Heart:  Regular, slightly tachy, no murmurs, no rubs, no gallops   Abdomen:  Soft, no tenderness, no rebound, no guarding, no masses, nondistended.  Minimal discomfort on lower abdominal palpation.  No peritonitis.   Back:  Normal curvature, no tenderness.  Negative Murphy's punch.   Extremities:  FROM, no deformities, no edema, no erythema   Neuro:  Physiologic, oriented x3, full affect, no localizing findings   Skin:  Normal, no rashes, no lesions noted.        Rectal: External exam shows a healed I&D wound on the right buttock.  This ischiorectal fossa is nontender.  No erythema.  No induration.  There is no perianal excoriation, no fissures, no fistula openings.  There is an open wound in the left medial thigh from the Bartholin's gland I&D.  Catheter has been removed.  There is some induration tracking from this area to the left anteromedial buttock/thigh tissues.  There is induration and tenderness here.  I cannot appreciate cellulitis.  It seems to be centered around the buttock/thigh crease.  I do not feel any fluctuance.  There are no open wounds, nothing is draining.  No significant external hemorrhoids.  Superior gluteal cleft reveals no pits or signs suggestive of prior pilonidal disease.  Valsalva reveals no prolapse of tissue.  Anal wink is present.   DRE and anoscopy deferred.    Bedside ultrasound of the left buttock done by me.  There is extensive edema in the soft tissues of the buttock.  There is loss of the normal fat planes and normal ultrasound appearance.  I cannot find any definite fluid collections to drain despite an extensive examination of the tissues.      Recent Labs      01/18/16   1310   WBC  18.4*   HGB  12.1   HCT  36.9   PLT  344       Recent Labs       01/18/16   1310   NA  136   K  3.4*   CL  102   CO2  24   BUN  6   CREA  0.83   TBILI  1.0   SGOT  36   ALT  40   AP  133       Blood cx pending  UDS positive for THC and opiates  UA with 10-20 epis, 2+mucous, 10-20 WBC, 1+bacteria, 50-100 casts.    HCG negative  Lactate 1.2  Procalcitonin 0.1  Labs reviewed by me.    IMAGING: Images reviewed by me.  01/18/16--CT pelvis: "Normal-appearing urinary bladder. Relatively normal-appearing uterus and bilateral ovaries. However there is a complex appearing mass in the left adnexal region measuring approximately 4.2 x 5.5 cm, while this could represent a solid adnexal mass, tubo-ovarian abscess cannot be excluded by imaging.  Normal-appearing partially visualized small bowel, colon and appendix. No evidence of pelvic free fluid. There are mildly prominent left inguinal lymph nodes measuring up to 12 mm in short axis, likely reactive. No evidence of significant right inguinal or pelvic sidewall lymphadenopathy. Visualized osseous structures unremarkable.  ?? There is asymmetric subcutaneous soft tissue edema and inflammatory fat stranding in the left perineum and medial buttock soft tissues, with a single small focus of subcutaneous air, if there has not been recent injection or intervention, this would be concerning for a deep soft tissue infection by an air producing organism such as necrotizing fasciitis /Fournier's gangrene."    ASSESSMENT:  Left buttock cellulitis, no evidence of abscess  Leukocytosis, possibly due to above  Anal/rectal pain, due to above  Pelvic mass  Hypokalemia    RECOMMENDATIONS:  --I reassured her that I do not see any abscess on ultrasound of the buttocks.  There are no clear/large fluid pockets seen on the CT scan either, so the two tests correlate well.  As such, there is no indication for I&D.  I think the pocket of air seen on CT in the tissues is related to the recently removed Word catheter.  There were no signs of gangrene in the tissues.   --given that she is tolerating PO and has not started the most recent antibiotics course, I think a trial of outpatient therapy is reasonable.  --she has been given antibiotics from the ED.  I recommend she take those  --I recommend she start warm soaks in the tub.  This will help with induration and may help any process resolve or mature to the point that it can be drained  --she will have an ultrasound of her pelvis while in the ED and Gynecology will see her regarding the pelvic mass  --she agrees to return for fevers, worsening pain, inability to keep down antibiotics, n/v, or other concerns.  --otherwise, I will plan to discuss her case with Dr Marrian Salvage on Monday and arrange appropriate followup    She is very amenable to this plan, and agrees to return and/or follow up as appropriate.    Kasandra Knudsen, MD  01/18/2016      Elements of this note have been created with speech-recognition software.  As a result, errors in speech recognition may have occurred.

## 2016-01-18 NOTE — Progress Notes (Signed)
Notified US tech

## 2016-01-18 NOTE — Consults (Signed)
Gynecology Consult    Name: Patricia Mcdonald MRN: [id number] SSN: [id number]    Date of Birth: 08-26-1996  Age: 20 y.o.  Sex: female       Subjective:      Chief complaint:  Adnexal mass    Patricia Mcdonald is a 20 y.o. African American female with a history of bartholin gland abscess / soft tissue infection following by general surgery returned today with buttock pain and fever.  She has been followed by Dr. Winona Legatooler and [facility]  She has an appt in our office Monday.  Her white count was found to be 18K.  Preg test neg. CT with solid ovarian mass / possible TOA.  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  She is interviewed with her grandmother present.    The current method of family planning is none.      Past ob hx:  G0    Past gyn hx:  Menarche age 87 - cycles every 30 days lasting 5 days  -+cramps.  No abnl pap or STIs.  Says not sexually active.  Not on birth control.    History reviewed. No pertinent past medical history.  History reviewed. No pertinent surgical history.  OB History     No data available        Allergies   Allergen Reactions   ??? Sulfa (Sulfonamide Antibiotics) Rash     Current Outpatient Prescriptions   Medication Sig Dispense Refill   ??? traMADol (ULTRAM) 50 mg tablet Take 1 Tab by mouth every six (6) hours as needed for Pain. Max Daily Amount: 200 mg. 10 Tab 0   ??? doxycycline (VIBRA-TABS) 100 mg tablet Take 1 Tab by mouth two (2) times a day for 10 days. 20 Tab 0   ??? oxyCODONE-acetaminophen (PERCOCET) 5-325 mg per tablet Take 2 Tabs by mouth every four (4) hours as needed for Pain.         History reviewed. No pertinent family history.  Social History     Social History   ??? Marital status: SINGLE     Spouse name: N/A   ??? Number of children: N/A   ??? Years of education: N/A     Occupational History   ??? Student      Social History Main Topics   ??? Smoking status: Never Smoker   ??? Smokeless tobacco: Not on file   ??? Alcohol use Not on file    ??? 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 Sexual activity: Not on file     Other Topics Concern   ??? Not on file     Social History Narrative       Review of Systems:  A comprehensive review of systems was negative except for that written in the History of Present Illness.     Objective:     Vitals:    01/18/16 1153 01/18/16 1304 01/18/16 1512 01/18/16 1712   BP: 110/78 127/77  (!) 124/91   Pulse: 98 (!) 140  (!) 115   Resp: 20 22  22    Temp: 98.8 ??F (37.1 ??C) 98.9 ??F (37.2 ??C) (!) 100.5 ??F (38.1 ??C) 99.4 ??F (37.4 ??C)   SpO2: 96% 100%  100%   Weight: 220 lb (99.8 kg)      Height: 5\' 3"  (1.6 m)          General:  alert, cooperative, no distress, appears stated age  Lungs:  clear to auscultation bilaterally   Heart:  regular rate and rhythm, S1, S2 normal, no murmur, click, rub or gallop   Abdomen: soft, non-tender. Bowel sounds normal. No masses,  no organomegaly.  Obese            Extremities:  extremities normal, atraumatic, no cyanosis or edema   Neurologic:  negative   Psychiatric:  non focal     Assessment:     Adnexal mass with fever / elevated white count.  Likely related to cellulitis unrelated to gyn.  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      Plan:        Advised pt to keep her follow up appt on Monday.   Consider speculum exam / STD cultures in office.  Contraceptive counseling.  Follow up repeat US in several week.  Pt aware.     Signed By:  Roosvelt Maser, DO     January 18, 2016

## 2016-01-18 NOTE — ED Notes (Signed)
I have reviewed discharge instructions with the patient.  The patient verbalized understanding. Prescriptions given and reviewed. MD aware of patient HR and temp prior to discharge. Patient given tylenol prior to discharge. Discharged ambulatory with family at bedside.

## 2016-01-19 LAB — EKG, 12 LEAD, INITIAL
Atrial Rate: 130 {beats}/min
Calculated P Axis: 70 degrees
Calculated R Axis: 60 degrees
Calculated T Axis: 32 degrees
P-R Interval: 164 ms
Q-T Interval: 294 ms
QRS Duration: 68 ms
QTC Calculation (Bezet): 432 ms
Ventricular Rate: 130 {beats}/min

## 2016-01-19 MED ORDER — ACETAMINOPHEN 500 MG TAB
500 mg | ORAL | Status: AC
Start: 2016-01-19 — End: 2016-01-18
  Administered 2016-01-19: 01:00:00 via ORAL

## 2016-01-19 MED ORDER — CLINDAMYCIN 300 MG CAP
300 mg | ORAL_CAPSULE | Freq: Four times a day (QID) | ORAL | 0 refills | Status: AC
Start: 2016-01-19 — End: 2016-01-25

## 2016-01-19 MED FILL — MAPAP EXTRA STRENGTH 500 MG TABLET: 500 mg | ORAL | Qty: 2

## 2016-01-20 ENCOUNTER — Encounter: Attending: Obstetrics & Gynecology

## 2016-01-22 ENCOUNTER — Ambulatory Visit: Admit: 2016-01-22 | Discharge: 2016-01-22 | Payer: PRIVATE HEALTH INSURANCE | Attending: Obstetrics & Gynecology

## 2016-01-22 DIAGNOSIS — N751 Abscess of Bartholin's gland: Secondary | ICD-10-CM

## 2016-01-22 NOTE — Progress Notes (Signed)
Formatting of this note is different from the original.  Patricia Mcdonald  is a 20 y.o. female, G0P0.  Patient's last menstrual period was 01/20/2016 (exact date)., who is being seen for an ER follow up.  She had history fo bartholin cyst and ward catheter. She feels a lot better from this - feels a small hole there.  Pt has never been sexually active but did have a pelvic US in ER.  She does not use tampons either.  Has been bleeding since.  Her [place] suggests a pedunculated fibroid -pt does pelvic pain or painful periods.   She has been seeing Dr. Chrisandra Carota and has an abscess on her buttock /leg that ruptured after she left ER.  She has a bandage on it now and has follow up with him.  She denies fever.      HISTORY:  Sexual History:   History   Sexual Activity   ? Sexual activity: Not Currently       Current Outpatient Prescriptions on File Prior to Visit   Medication Sig Dispense Refill   ? clindamycin (CLEOCIN) 300 mg capsule Take 1 Cap by mouth four (4) times daily for 7 days. 28 Cap 0   ? traMADol (ULTRAM) 50 mg tablet Take 1 Tab by mouth every six (6) hours as needed for Pain. Max Daily Amount: 200 mg. 10 Tab 0   ? oxyCODONE-acetaminophen (PERCOCET) 5-325 mg per tablet Take 2 Tabs by mouth every four (4) hours as needed for Pain.       No current facility-administered medications on file prior to visit.      [name]  has a past medical history of Bartholin cyst. .    She  has a past surgical history that includes other surgical.    Her current meds are:    Current Outpatient Prescriptions:   ?  clindamycin (CLEOCIN) 300 mg capsule, Take 1 Cap by mouth four (4) times daily for 7 days., Disp: 28 Cap, Rfl: 0  ?  traMADol (ULTRAM) 50 mg tablet, Take 1 Tab by mouth every six (6) hours as needed for Pain. Max Daily Amount: 200 mg., Disp: 10 Tab, Rfl: 0  ?  oxyCODONE-acetaminophen (PERCOCET) 5-325 mg per tablet, Take 2 Tabs by mouth every four (4) hours as needed for Pain., Disp: , Rfl:      Her family history is not  on file.     ROS   Feeling well. No dyspnea or chest pain on exertion.  No abdominal pain, change in bowel habits, black or bloody stools.      PHYSICAL EXAM:  Visit Vitals   ? BP 134/90   ? Wt 241 lb (109.3 kg)   ? LMP 01/20/2016 (Exact Date)   ? BMI 42.69 kg/m2     The patient appears well, alert, oriented x 3, in no distress.  Pelvic: external vulva without defect and moderate amount of menstrual blood present.  Pelvic exam deferred.   Bandage on left gluteal cheek intact.  No sign of infection surrounding this.      ASSESSMENT:  Encounter Diagnoses   Name Primary?   ? Bartholin's gland abscess Yes   ? Uterine leiomyoma, unspecified location      obesity  PLAN:  All questions answered.  Discussed that I will see her back in 6 months for annual.  If symptoms return, come before then.  If she is sexually active, use condoms.  She denies needs for birth control.  Keep her  follow up with [facility].  Advised pt to lose weight - not sure why she had these infections.    No orders of the defined types were placed in this encounter.    Dutch Quint Alt, DO       Electronically signed by Maryan Rued, DO at 01/22/2016  3:47 PM EDT

## 2016-01-22 NOTE — Progress Notes (Signed)
Patricia Mcdonald  is a 20 y.o. female, G0P0.  Patient's last menstrual period was 01/20/2016 (exact date)., who is being seen for an ER follow up.  She had history fo bartholin cyst and ward catheter. She feels a lot better from this - feels a small hole there.  Pt has never been sexually active but did have a pelvic us in ER.  She does not use tampons either.  Has been bleeding since.  Her us suggests a pedunculated fibroid -pt does pelvic pain or painful periods.   She has been seeing Dr. Winona Legatooler and has an abscess on her buttock /leg that ruptured after she left ER.  She has a bandage on it now and has follow up with him.  She denies fever.      HISTORY:  Sexual History:   History   Sexual Activity   ??? Sexual activity: Not Currently        Current Outpatient Prescriptions on File Prior to Visit   Medication Sig Dispense Refill   ??? clindamycin (CLEOCIN) 300 mg capsule Take 1 Cap by mouth four (4) times daily for 7 days. 28 Cap 0   ??? traMADol (ULTRAM) 50 mg tablet Take 1 Tab by mouth every six (6) hours as needed for Pain. Max Daily Amount: 200 mg. 10 Tab 0   ??? oxyCODONE-acetaminophen (PERCOCET) 5-325 mg per tablet Take 2 Tabs by mouth every four (4) hours as needed for Pain.       No current facility-administered medications on file prior to visit.            Patricia Mcdonald  has a past medical history of Bartholin cyst. .    She  has a past surgical history that includes other surgical.    Her current meds are:    Current Outpatient Prescriptions:   ???  clindamycin (CLEOCIN) 300 mg capsule, Take 1 Cap by mouth four (4) times daily for 7 days., Disp: 28 Cap, Rfl: 0  ???  traMADol (ULTRAM) 50 mg tablet, Take 1 Tab by mouth every six (6) hours as needed for Pain. Max Daily Amount: 200 mg., Disp: 10 Tab, Rfl: 0  ???  oxyCODONE-acetaminophen (PERCOCET) 5-325 mg per tablet, Take 2 Tabs by mouth every four (4) hours as needed for Pain., Disp: , Rfl:      Her family history is not on file.     ROS    Feeling well. No dyspnea or chest pain on exertion.  No abdominal pain, change in bowel habits, black or bloody stools.        PHYSICAL EXAM:  Visit Vitals   ??? BP 134/90   ??? Wt 241 lb (109.3 kg)   ??? LMP 01/20/2016 (Exact Date)   ??? BMI 42.69 kg/m2     The patient appears well, alert, oriented x 3, in no distress.  Pelvic: external vulva without defect and moderate amount of menstrual blood present.  Pelvic exam deferred.   Bandage on left gluteal cheek intact.  No sign of infection surrounding this.      ASSESSMENT:  Encounter Diagnoses   Name Primary?   ??? Bartholin's gland abscess Yes   ??? Uterine leiomyoma, unspecified location      obesity  PLAN:  All questions answered.  Discussed that I will see her back in 6 months for annual.  If symptoms return, come before then.  If she is sexually active, use condoms.  She denies needs for birth control.  Keep her follow  up with [facility].  Advised pt to lose weight - not sure why she had these infections.    No orders of the defined types were placed in this encounter.      Patricia Metro Stefanny Pieri, DO

## 2016-01-24 LAB — CULTURE, BLOOD
Culture result:: NO GROWTH
Culture result:: NO GROWTH

## 2016-01-30 ENCOUNTER — Ambulatory Visit: Payer: PRIVATE HEALTH INSURANCE

## 2016-07-27 ENCOUNTER — Encounter: Payer: PRIVATE HEALTH INSURANCE | Attending: Obstetrics & Gynecology

## 2022-12-16 ENCOUNTER — Emergency Department: Admit: 2022-12-16 | Payer: PRIVATE HEALTH INSURANCE

## 2022-12-16 ENCOUNTER — Inpatient Hospital Stay
Admit: 2022-12-16 | Discharge: 2022-12-16 | Disposition: A | Discharge status: Home / Self Care | Payer: PRIVATE HEALTH INSURANCE | Attending: Emergency Medicine

## 2022-12-16 DIAGNOSIS — M25511 Pain in right shoulder: Secondary | ICD-10-CM

## 2022-12-16 MED ORDER — DICLOFENAC SODIUM 50 MG PO TBEC
50 | ORAL_TABLET | Freq: Two times a day (BID) | ORAL | 0 refills | Status: AC
Start: 2022-12-16 — End: 2022-12-23

## 2022-12-16 MED ORDER — METHOCARBAMOL 500 MG PO TABS
500 | ORAL | Status: AC
Start: 2022-12-16 — End: 2022-12-16
  Administered 2022-12-16: 21:00:00 500 mg via ORAL

## 2022-12-16 MED ORDER — METHOCARBAMOL 500 MG PO TABS
500 | ORAL_TABLET | Freq: Three times a day (TID) | ORAL | 0 refills | Status: AC
Start: 2022-12-16 — End: 2022-12-21

## 2022-12-16 MED FILL — METHOCARBAMOL 500 MG PO TABS: 500 MG | ORAL | Qty: 1

## 2022-12-16 NOTE — ED Notes (Signed)
I have reviewed discharge instructions with the patient.  The patient verbalized understanding.    Patient left ED via Discharge Method: ambulatory to Home with self.    Opportunity for questions and clarification provided.       Patient given 2 scripts.         To continue your aftercare when you leave the hospital, you may receive an automated call from our care team to check in on how you are doing.  This is a free service and part of our promise to provide the best care and service to meet your aftercare needs." If you have questions, or wish to unsubscribe from this service please call [phone].  Thank you for Choosing our [facility] Emergency Department.        Sabra Heck, RN  12/16/22 1719
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
## 2022-12-16 NOTE — Discharge Instructions (Addendum)
You will likely have worsening pain and stiffness over the next 2 to 3 days.  Being up walking around and being active can help reduce your symptoms and speed up your recovery.  However, it may still take 2 to 3 weeks before you are back to normal.    Please return with any confusion, vomiting, difficulty breathing, or additional concerns.    Follow-up with a primary care doctor for reevaluation as needed.

## 2022-12-30 ENCOUNTER — Emergency Department: Admit: 2022-12-30 | Payer: PRIVATE HEALTH INSURANCE

## 2022-12-30 ENCOUNTER — Inpatient Hospital Stay
Admit: 2022-12-30 | Discharge: 2022-12-30 | Disposition: A | Discharge status: Home / Self Care | Payer: PRIVATE HEALTH INSURANCE

## 2022-12-30 DIAGNOSIS — M546 Pain in thoracic spine: Secondary | ICD-10-CM

## 2022-12-30 MED ORDER — TIZANIDINE HCL 2 MG PO TABS
2 | ORAL_TABLET | Freq: Two times a day (BID) | ORAL | 0 refills | Status: AC
Start: 2022-12-30 — End: ?
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
## 2022-12-30 NOTE — Discharge Instructions (Signed)
Use meds as directed along with most heat or heat patches, call primary md office of choice to arrange follow up appt    We would love to help you get a primary care doctor for follow-up after your emergency department visit.    Please call [phone] between 7AM - 6PM Monday to Friday.  A care navigator will be able to assist you with setting up a doctor close to your home.

## 2022-12-30 NOTE — ED Notes (Signed)
I have reviewed discharge instructions with the patient.  The patient verbalized understanding.    Patient left ED via Discharge Method: ambulatory to Home with self.    Opportunity for questions and clarification provided.       Patient given 1 scripts.         To continue your aftercare when you leave the hospital, you may receive an automated call from our care team to check in on how you are doing.  This is a free service and part of our promise to provide the best care and service to meet your aftercare needs." If you have questions, or wish to unsubscribe from this service please call [phone].  Thank you for Choosing our [facility] Emergency Department.        Marisa Sprinkles, LPN  [id number]

## 2023-03-06 ENCOUNTER — Encounter

## 2023-03-12 ENCOUNTER — Inpatient Hospital Stay: Payer: PRIVATE HEALTH INSURANCE

## 2023-03-12 ENCOUNTER — Encounter

## 2023-03-12 ENCOUNTER — Inpatient Hospital Stay: Admit: 2023-03-12 | Payer: PRIVATE HEALTH INSURANCE

## 2023-03-12 DIAGNOSIS — R1900 Intra-abdominal and pelvic swelling, mass and lump, unspecified site: Secondary | ICD-10-CM

## 2024-07-02 ENCOUNTER — Inpatient Hospital Stay
Admit: 2024-07-02 | Discharge: 2024-07-02 | Disposition: A | Discharge status: Home / Self Care | Payer: PRIVATE HEALTH INSURANCE | Arrived: VH

## 2024-07-02 ENCOUNTER — Emergency Department: Admit: 2024-07-02 | Payer: PRIVATE HEALTH INSURANCE

## 2024-07-02 DIAGNOSIS — N764 Abscess of vulva: Principal | ICD-10-CM

## 2024-07-02 LAB — COMPREHENSIVE METABOLIC PANEL
ALT: 16 U/L (ref 8–45)
AST: 17 U/L (ref 15–37)
Albumin/Globulin Ratio: 0.9 — ABNORMAL LOW (ref 1.0–1.9)
Albumin: 3.7 g/dL (ref 3.5–5.0)
Alk Phosphatase: 107 U/L — ABNORMAL HIGH (ref 35–104)
Anion Gap: 11 mmol/L (ref 7–16)
BUN: 10 mg/dL (ref 6–23)
CO2: 24 mmol/L (ref 20–29)
Calcium: 9.6 mg/dL (ref 8.8–10.2)
Chloride: 105 mmol/L (ref 98–107)
Creatinine: 0.66 mg/dL (ref 0.60–1.10)
Est, Glom Filt Rate: >90 ml/min/1.73m2 (ref >60)
Globulin: 4.3 g/dL — ABNORMAL HIGH (ref 2.3–3.5)
Glucose: 93 mg/dL (ref 70–99)
Potassium: 4 mmol/L (ref 3.5–5.1)
Sodium: 139 mmol/L (ref 136–145)
Total Bilirubin: 0.4 mg/dL (ref 0.0–1.2)
Total Protein: 8 g/dL (ref 6.3–8.2)

## 2024-07-02 LAB — CBC WITH AUTO DIFFERENTIAL
Basophils %: 0.5 % (ref 0.0–2.0)
Basophils Absolute: 0.05 K/UL (ref 0.00–0.20)
Eosinophils %: 1.5 % (ref 0.5–7.8)
Eosinophils Absolute: 0.16 K/UL (ref 0.00–0.80)
Hematocrit: 39.1 % (ref 35.8–46.3)
Hemoglobin: 12 g/dL (ref 11.7–15.4)
Immature Granulocytes %: 0.4 % (ref 0.0–5.0)
Immature Granulocytes Absolute: 0.04 K/UL (ref 0.0–0.5)
Lymphocytes %: 19.7 % (ref 13.0–44.0)
Lymphocytes Absolute: 2.05 K/UL (ref 0.50–4.60)
MCH: 21.2 pg — ABNORMAL LOW (ref 26.1–32.9)
MCHC: 30.7 g/dL — ABNORMAL LOW (ref 31.4–35.0)
MCV: 69 FL — ABNORMAL LOW (ref 82–102)
MPV: 10.5 FL (ref 9.4–12.3)
Monocytes %: 7.5 % (ref 4.0–12.0)
Monocytes Absolute: 0.78 K/UL (ref 0.10–1.30)
Neutrophils %: 70.4 % (ref 43.0–78.0)
Neutrophils Absolute: 7.33 K/UL (ref 1.70–8.20)
Platelets: 265 K/uL (ref 150–450)
RBC: 5.67 M/uL — ABNORMAL HIGH (ref 4.05–5.2)
RDW: 18.5 % — ABNORMAL HIGH (ref 11.9–14.6)
WBC: 10.4 K/uL (ref 4.3–11.1)
nRBC: 0 K/uL (ref 0.0–0.2)

## 2024-07-02 LAB — PROCALCITONIN: Procalcitonin: 0.03 ng/mL (ref 0.00–0.10)

## 2024-07-02 LAB — LACTATE, SEPSIS: Lactic Acid, Sepsis: 1.2 MMOL/L (ref 0.5–2.0)

## 2024-07-02 MED ORDER — LIDOCAINE HCL (PF) 1 % IJ SOLN
1 | Freq: Once | INTRAMUSCULAR | Status: AC
Start: 2024-07-02 — End: 2024-07-02
  Administered 2024-07-02: 15:00:00 5 mL via INTRADERMAL

## 2024-07-02 MED ORDER — DOXYCYCLINE HYCLATE 100 MG PO TABS
100 | ORAL_TABLET | Freq: Two times a day (BID) | ORAL | 0 refills | Status: AC
Start: 2024-07-02 — End: 2024-07-09

## 2024-07-02 MED ORDER — HYDROCODONE-ACETAMINOPHEN 5-325 MG PO TABS
5-325 | ORAL_TABLET | ORAL | 0 refills | Status: AC | PRN
Start: 2024-07-02 — End: 2024-07-05

## 2024-07-02 MED ORDER — CEPHALEXIN 500 MG PO CAPS
500 | ORAL_CAPSULE | Freq: Two times a day (BID) | ORAL | 0 refills | Status: AC
Start: 2024-07-02 — End: 2024-07-09

## 2024-07-02 MED ORDER — KETOROLAC TROMETHAMINE 15 MG/ML IJ SOLN
15 | Freq: Once | INTRAMUSCULAR | Status: AC
Start: 2024-07-02 — End: 2024-07-02
  Administered 2024-07-02: 14:00:00 15 mg via INTRAVENOUS

## 2024-07-02 MED ORDER — METRONIDAZOLE 500 MG PO TABS
500 | ORAL_TABLET | Freq: Three times a day (TID) | ORAL | 0 refills | Status: DC
Start: 2024-07-02 — End: 2024-07-02

## 2024-07-02 MED FILL — LIDOCAINE HCL (PF) 1 % IJ SOLN: 1 % | INTRAMUSCULAR | Qty: 30 | Fill #0

## 2024-07-02 MED FILL — KETOROLAC TROMETHAMINE 15 MG/ML IJ SOLN: 15 mg/mL | INTRAMUSCULAR | Qty: 1 | Fill #0

## 2024-07-02 NOTE — ED Notes (Signed)
 Patient mobility status ambulates with no difficulty.     I have reviewed discharge instructions with the patient.  The patient verbalized understanding.    Patient left ED via Discharge Method: ambulatory to Home with Extended Family:.    Opportunity for questions and clarification provided.     Patient given 3 scripts.           Gwenn Christians, RN  07/02/24 1536

## 2024-07-02 NOTE — Care Coordination-Inpatient (Signed)
 Referral faxed to [facility] at [phone]

## 2024-07-02 NOTE — ED Triage Notes (Signed)
 Pt arrives ambulatory for complaint of a cyst in her vagina. Visible from the outside. First noticed this problem on Thursday.

## 2024-07-02 NOTE — Discharge Instructions (Signed)
 As discussed, you should call the referral that I have placed to the [facility] for woman, their number is [phone].  Please call them as soon as they open in the morning.  Please take the pain medicine antibiotics as directed with food and remain upright for at least 1 hour.

## 2024-07-02 NOTE — ED Provider Notes (Signed)
 Emergency Department Provider Note       [facility] EMERGENCY DEPT   PCP: No primary care provider on file.   Age: 28 y.o.   Sex: female     DISPOSITION Decision To Discharge 07/02/2024 03:20:08 PM    ICD-10-CM    1. Vulvar abscess  N76.4 HYDROcodone -acetaminophen  (NORCO) 5-325 MG per tablet     AFL - [facility], [place]          Medical Decision Making     28 year old female presenting for chief complaint of suspected Bartholin gland abscess.  Afebrile hemodynamically stable, ambulatory without difficulty.  Following a chaperoned exam, the lesion appeared to be more midline and did not appear to be related to either Bartholin gland structure as both were soft and nontender.  Labs obtained to rule out septic complications no leukocytosis or left shift, lactate was normal at 1.2 and procalcitonin normal at 0.03.  CMP grossly unremarkable.  Pelvic ultrasound completed given the abnormal appearance of the mass in question and given patient's prior history of cysts along with fibroids.  Study was limited due to excess bowel gas.  Case discussed with [facility] hospitalist as noted below.  Given the size of the structure at roughly 5 cm along with the right sided radicular pain, I feel it is best to treat the patient with antibiotics and anti-inflammatories and refer to OB/GYN for immediate evaluation first thing tomorrow morning.  I suspect this is a vulvar abscess but given the size and the potential incursion into the surrounding tissue femoral tissue leading to the radicular symptoms, I feel this is best evaluated and treated by OB/GYN directly as there may be a need for anesthesia or surgical involvement.  I discussed this case at length with both my attending Dr. Eudora, [facility] hospitalist, as well as the patient.  All parties were in agreement with the discharge plan as well as immediate follow-up for first thing in the morning for OB/GYN.  Urgent referral placed.  Patient was comfortable with leaving today  and did not want to be admitted.  Patient given extremely strict return precautions and acknowledged understanding.    Appropriate follow-up and return precautions were reviewed and provided both verbally and in writing on discharge summary. Patient was counseled on the diagnosis, treatment options, expected course, and any potential red flag symptoms. All questions were addressed. The risks and benefits of the plan were discussed in plain language. The patient verbalized understanding and agreement with the disposition.     I have reviewed the Radiologist interpretation of the radiologic studies. My medical decision making regarding the radiologic studies is based on the interpretation report of the board certified Radiologist.    ED Course as of 07/02/24 1952   Sun Jul 02, 2024   1515 Case discussed extensively with [facility] hospitalist who advised the patient to be seen first thing tomorrow by the next available OB/GYN for further evaluation.  Was in agreement with discharging patient on antibiotics as well as opiates for pain control. [MW]      ED Course User Index  [MW] Irvin Ozell PARAS, PA-C     1 acute complicated illness or injury.  Over the counter drug management performed.  Prescription drug management performed.  Patient was discharged risks and benefits of hospitalization were considered.  Discussion with external consultants.  Shared medical decision making was utilized in creating the patients health plan today.  I independently ordered and reviewed each unique test.  The management of this patient was discussed with an external consultant.            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  Patient reports that the pain initially began progressively but she really had to call out of work on Thursday due to the severity, has been utilizing warm water soaks without  relief, pain has been increased to the point that she is now getting referred pain down her right leg into her foot, worse with walking.  Notes that the lesion is visible from the extra aspect of the vagina, has had I&D of a Bartholin gland cyst before from OB/GYN.    The history is provided by the patient. No language interpreter was used.     Physical Exam     Vitals signs and nursing note reviewed:  Vitals:    07/02/24 0936 07/02/24 0958 07/02/24 1535   BP: (!) 173/109  (!) 140/90   Pulse: (!) 107  88   Resp:  16 16   Temp: 99.5 F (37.5 C)  97.7 F (36.5 C)   TempSrc: Oral  Oral   SpO2: 98%  99%   Weight:   136.1 kg (300 lb)   Height:   1.524 m (5')      Physical Exam  Vitals and nursing note reviewed. Exam conducted with a chaperone present [place]).   Constitutional:       General: She is not in acute distress.     Appearance: Normal appearance. She is not ill-appearing.   HENT:      Head: Normocephalic and atraumatic.      Nose: Nose normal.      Mouth/Throat:      Mouth: Mucous membranes are moist.      Pharynx: Oropharynx is clear.   Eyes:      Extraocular Movements: Extraocular movements intact.      Conjunctiva/sclera: Conjunctivae normal.      Pupils: Pupils are equal, round, and reactive to light.   Cardiovascular:      Rate and Rhythm: Normal rate and regular rhythm.      Heart sounds: Normal heart sounds.   Pulmonary:      Effort: Pulmonary effort is normal.      Breath sounds: Normal breath sounds.   Abdominal:      General: Abdomen is flat.      Palpations: Abdomen is soft.      Tenderness: There is no abdominal tenderness. There is no guarding or rebound.   Genitourinary:     Exam position: Supine.      Labia:         Right: No rash or tenderness.         Left: No rash or tenderness.       Comments: Midline 5cm fluctuant mass with TTP  No evidence of bilateral bartholin gland abscesses  Musculoskeletal:      Cervical back: Normal range of motion and neck supple.   Skin:     General: Skin is  warm and dry.   Neurological:      General: No focal deficit present.      Mental Status: She is alert and oriented to person, place, and time.   Psychiatric:         Mood and Affect: Mood normal.         Behavior: Behavior normal.  Thought Content: Thought content normal.         Judgment: Judgment normal.          Procedures     Procedures    Orders placed during this emergency department visit:     Orders Placed This Encounter   Procedures    US  PELVIS COMPLETE NON-OB TRANSABDOMINAL AND TRANSVAGINAL    CMP    CBC with Auto Differential    Procalcitonin    Lactate, Sepsis    AFL - [facility], [place]    Saline lock IV        Medications given during this emergency department visit:     Medications   ketorolac  (TORADOL ) injection 15 mg (15 mg IntraVENous Given 07/02/24 1025)   lidocaine  PF 1 % injection 5 mL (5 mLs IntraDERmal Given 07/02/24 1127)       New prescriptions:     Discharge Medication List as of 07/02/2024  3:24 PM        START taking these medications    Details   doxycycline  hyclate (VIBRA -TABS) 100 MG tablet Take 1 tablet by mouth 2 times daily for 7 days, Disp-14 tablet, R-0Normal      cephALEXin  (KEFLEX ) 500 MG capsule Take 1 capsule by mouth 2 times daily for 7 days, Disp-14 capsule, R-0Normal      HYDROcodone -acetaminophen  (NORCO) 5-325 MG per tablet Take 1 tablet by mouth every 4 hours as needed for Pain for up to 3 days. Intended supply: 3 days. Take lowest dose possible to manage pain Max Daily Amount: 6 tablets, Disp-18 tablet, R-0Normal              Past History and Complexity:     No past medical history on file.     No past surgical history on file.     Social History     Socioeconomic History    Marital status: Single   Substance and Sexual Activity    Alcohol use: Never        Discharge Medication List as of 07/02/2024  3:24 PM        CONTINUE these medications which have NOT CHANGED    Details   tiZANidine  (ZANAFLEX ) 2 MG tablet Take 1 tablet by mouth in the morning  and 1 tablet in the evening., Disp-30 tablet, R-0Print      diclofenac  (VOLTAREN ) 50 MG EC tablet Take 1 tablet by mouth 2 times daily for 7 days, Disp-14 tablet, R-0Normal              Results from this emergency department visit:      Results for orders placed or performed during the hospital encounter of 07/02/24   US  PELVIS COMPLETE NON-OB TRANSABDOMINAL AND TRANSVAGINAL    Narrative    PELVIC ULTRASOUND.    HISTORY:  Midline vaginal mass with TTP, prior hx of cysts as well as recurrent  abscesses     TECHNIQUE:  Transabdominal and endovaginal  images were obtained of the pelvis.    COMPARISON: Ultrasound dated 03/12/2023    FINDINGS:    - UTERUS: Measures 6.8 cm x 4.8 cm x 3.2 cm in length.  Endometrial stripe  measures 7.6 mm. Limited visualization due to overlying bowel gas.    Ovaries bilaterally could not be visualized due to overlying bowel gas. No free  fluid is seen.    No significant free fluid.      Impression    Limited study due to excessive bowel gas. Ovaries  bilaterally could  not be visualized.          Electronically signed by Harold Erps   CMP   Result Value Ref Range    Sodium 139 136 - 145 mmol/L    Potassium 4.0 3.5 - 5.1 mmol/L    Chloride 105 98 - 107 mmol/L    CO2 24 20 - 29 mmol/L    Anion Gap 11 7 - 16 mmol/L    Glucose 93 70 - 99 mg/dL    BUN 10 6 - 23 MG/DL    Creatinine 9.33 9.39 - 1.10 MG/DL    Est, Glom Filt Rate >90 >60 ml/min/1.63m2    Calcium 9.6 8.8 - 10.2 MG/DL    Total Bilirubin 0.4 0.0 - 1.2 MG/DL    ALT 16 8 - 45 U/L    AST 17 15 - 37 U/L    Alk Phosphatase 107 (H) 35 - 104 U/L    Total Protein 8.0 6.3 - 8.2 g/dL    Albumin 3.7 3.5 - 5.0 g/dL    Globulin 4.3 (H) 2.3 - 3.5 g/dL    Albumin/Globulin Ratio 0.9 (L) 1.0 - 1.9     CBC with Auto Differential   Result Value Ref Range    WBC 10.4 4.3 - 11.1 K/uL    RBC 5.67 (H) 4.05 - 5.2 M/uL    Hemoglobin 12.0 11.7 - 15.4 g/dL    Hematocrit 60.8 64.1 - 46.3 %    MCV 69.0 (L) 82 - 102 FL    MCH 21.2 (L) 26.1 - 32.9 PG    MCHC 30.7  (L) 31.4 - 35.0 g/dL    RDW 81.4 (H) 88.0 - 14.6 %    Platelets 265 150 - 450 K/uL    MPV 10.5 9.4 - 12.3 FL    nRBC 0.00 0.0 - 0.2 K/uL    Differential Type AUTOMATED      Neutrophils % 70.4 43.0 - 78.0 %    Lymphocytes % 19.7 13.0 - 44.0 %    Monocytes % 7.5 4.0 - 12.0 %    Eosinophils % 1.5 0.5 - 7.8 %    Basophils % 0.5 0.0 - 2.0 %    Immature Granulocytes % 0.4 0.0 - 5.0 %    Neutrophils Absolute 7.33 1.70 - 8.20 K/UL    Lymphocytes Absolute 2.05 0.50 - 4.60 K/UL    Monocytes Absolute 0.78 0.10 - 1.30 K/UL    Eosinophils Absolute 0.16 0.00 - 0.80 K/UL    Basophils Absolute 0.05 0.00 - 0.20 K/UL    Immature Granulocytes Absolute 0.04 0.0 - 0.5 K/UL   Procalcitonin   Result Value Ref Range    Procalcitonin 0.03 0.00 - 0.10 ng/mL   Lactate, Sepsis   Result Value Ref Range    Lactic Acid, Sepsis 1.2 0.5 - 2.0 MMOL/L         US  PELVIS COMPLETE NON-OB TRANSABDOMINAL AND TRANSVAGINAL   Final Result   Limited study due to excessive bowel gas. Ovaries bilaterally could   not be visualized.               Electronically signed by Harold Erps                   No results for input(s): COVID19 in the last 72 hours.     Voice dictation software was used during the making of this note.  This software is not perfect and grammatical and other typographical errors may be present.  This note has not been completely proofread for errors.     Irvin Ozell PARAS, PA-C  07/02/24 1952
